# Patient Record
Sex: Female | Born: 1995 | Race: White | Hispanic: Yes | Marital: Single | Smoking: Former smoker
Health system: Southern US, Community
[De-identification: ages and names within clinical notes are randomized; demographics above are authoritative.]

## PROBLEM LIST (undated history)

## (undated) DIAGNOSIS — I10 Essential (primary) hypertension: Secondary | ICD-10-CM

## (undated) DIAGNOSIS — E079 Disorder of thyroid, unspecified: Secondary | ICD-10-CM

---
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
## 2015-11-04 ENCOUNTER — Encounter: Payer: Self-pay | Admitting: Emergency Medicine

## 2015-11-04 ENCOUNTER — Emergency Department
Admission: EM | Admit: 2015-11-04 | Discharge: 2015-11-04 | Disposition: A | Discharge status: Home / Self Care | Payer: BLUE CROSS/BLUE SHIELD | Attending: Emergency Medicine | Admitting: Emergency Medicine

## 2015-11-04 ENCOUNTER — Emergency Department: Payer: BLUE CROSS/BLUE SHIELD

## 2015-11-04 DIAGNOSIS — W06XXXA Fall from bed, initial encounter: Secondary | ICD-10-CM | POA: Insufficient documentation

## 2015-11-04 DIAGNOSIS — Y9289 Other specified places as the place of occurrence of the external cause: Secondary | ICD-10-CM | POA: Insufficient documentation

## 2015-11-04 DIAGNOSIS — H05232 Hemorrhage of left orbit: Secondary | ICD-10-CM

## 2015-11-04 DIAGNOSIS — Y999 Unspecified external cause status: Secondary | ICD-10-CM | POA: Insufficient documentation

## 2015-11-04 DIAGNOSIS — Y939 Activity, unspecified: Secondary | ICD-10-CM | POA: Diagnosis not present

## 2015-11-04 DIAGNOSIS — W2203XA Walked into furniture, initial encounter: Secondary | ICD-10-CM | POA: Diagnosis not present

## 2015-11-04 DIAGNOSIS — F17200 Nicotine dependence, unspecified, uncomplicated: Secondary | ICD-10-CM | POA: Insufficient documentation

## 2015-11-04 DIAGNOSIS — H1132 Conjunctival hemorrhage, left eye: Secondary | ICD-10-CM | POA: Diagnosis not present

## 2015-11-04 DIAGNOSIS — S0512XA Contusion of eyeball and orbital tissues, left eye, initial encounter: Secondary | ICD-10-CM | POA: Diagnosis not present

## 2015-11-04 DIAGNOSIS — S0990XA Unspecified injury of head, initial encounter: Secondary | ICD-10-CM | POA: Diagnosis present

## 2015-11-04 NOTE — ED Notes (Signed)
Pt to ed with c/o falling off the bed and hitting head on the nightstand on wed evening.  Pt with bruising noted to left eye and hematoma to left forehead.  Pt alert and oriented at this time.  Pt denies loss of consciousness during the fall.

## 2015-11-04 NOTE — ED Provider Notes (Signed)
CSN: [id number]     Arrival date & time 11/04/15  1051 History   First MD Initiated Contact with Patient 11/04/15 1125     Chief Complaint  Patient presents with  . Head Injury     (Consider location/radiation/quality/duration/timing/severity/associated sxs/prior Treatment) HPI  20 year old female presents to emergency department for evaluation of left orbital pain and swelling. Patient states 3 days ago, she was playing with her sister in the bed, patient rolled over, hit her left superior orbital rim on the dresser beside the bed. Patient had instant pain, no loss of consciousness. She developed significant swelling throughout the left eye lid. Patient has had no episodes of nausea or vomiting. Her pain and swelling have improved. Pain today is 2 out of 10 along the left superior orbital rim. Patient's eyelid is swollen shut. She denies any vision changes. No pain with extraocular movement.  History reviewed. No pertinent past medical history. History reviewed. No pertinent past surgical history. History reviewed. No pertinent family history. Social History  Substance Use Topics  . Smoking status: Current Every Day Smoker  . Smokeless tobacco: None  . Alcohol Use: Yes   OB History    Gravida Para Term Preterm AB TAB SAB Ectopic Multiple Living       Review of Systems  Constitutional: Negative for fever, chills, activity change and fatigue.  HENT: Negative for congestion, sinus pressure and sore throat.   Eyes: Negative for visual disturbance.  Respiratory: Negative for cough, chest tightness and shortness of breath.   Cardiovascular: Negative for chest pain and leg swelling.  Gastrointestinal: Negative for nausea, vomiting, abdominal pain and diarrhea.  Genitourinary: Negative for dysuria.  Musculoskeletal: Negative for joint swelling, arthralgias, gait problem, neck pain and neck stiffness.  Skin: Positive for wound. Negative for rash.  Neurological:  Negative for weakness, numbness and headaches.  Hematological: Negative for adenopathy.  Psychiatric/Behavioral: Negative for behavioral problems, confusion and agitation.      Allergies  Review of patient's allergies indicates no known allergies.  Home Medications   Prior to Admission medications   Not on File   BP 151/98 mmHg  Pulse 71  Temp(Src) 98.3 F (36.8 C) (Oral)  Resp 18  Ht  (1.702 m)  Wt 90.719 kg  BMI 31.32 kg/m2  SpO2 98%  LMP 11/04/2014 Physical Exam  Constitutional: She is oriented to person, place, and time. She appears well-developed and well-nourished. No distress.  HENT:  Head: Normocephalic.  Right Ear: External ear normal.  Left Ear: External ear normal.  Mouth/Throat: Oropharynx is clear and moist. No oropharyngeal exudate.  Examination of the left periorbital region shows patient has a periorbital hematoma along the left superior orbital rim into the left upper eyelid. Patient has full extraocular movement with no discomfort. There is mild subconjunctival hemorrhage with no visible injury to the cornea. Patient denies any vision changes. She is point tender along the superior orbital rim, mainly tender along the periorbital hematoma.  Eyes: EOM are normal. Pupils are equal, round, and reactive to light. Right eye exhibits no discharge. Left eye exhibits no discharge.  Neck: Normal range of motion. Neck supple.  Cardiovascular: Normal rate, regular rhythm and intact distal pulses.   Pulmonary/Chest: Effort normal and breath sounds normal. No respiratory distress. She exhibits no tenderness.  Abdominal: Soft.  Musculoskeletal: Normal range of motion. She exhibits no edema.  Neurological: She is alert and oriented to person, place, and time. She  has normal reflexes.  Skin: Skin is warm and dry.  Psychiatric: She has a normal mood and affect. Her behavior is normal. Thought content normal.    ED Course  Procedures (including critical care  time) Labs Review Labs Reviewed - No data to display  Imaging Review Ct Maxillofacial Wo Cm  11/04/2015  CLINICAL DATA:  20 year old female with a history of fall and trauma EXAM: CT MAXILLOFACIAL WITHOUT CONTRAST TECHNIQUE: Multidetector CT imaging of the maxillofacial structures was performed. Multiplanar CT image reconstructions were also generated. A small metallic BB was placed on the right temple in order to reliably differentiate right from left. COMPARISON:  None. FINDINGS: Mandible: No acute fracture identified. Temporomandibular joints approximated. Evidence of endodontal disease. Mid face: Soft tissue swelling with hyperdense soft tissues in the left supraorbital region. No underlying fracture. Paranasal sinuses: Trace paranasal sinus disease of the right maxillary sinus. Symmetry maintained of the bilateral facial soft tissues. No subcutaneous gas. No hematoma. No soft tissue abnormality. Multiple head and neck lymph nodes are present bilaterally, none of which are suspicious by CT size criteria or configuration. Orbits: Left preseptal soft tissue swelling. No radiopaque foreign body. No retro-orbital swelling. Unremarkable appearance the left globe. Unremarkable appearance of the visualized intracranial structures. Unremarkable appearance of the visualized craniocervical junction and upper cervical spine IMPRESSION: Soft tissue swelling in the left supraorbital region as well as the left preseptal region without evidence of fracture or globe injury. Signed, Yvone NeuJaime S. Loreta AveWagner, DO [facility] Electronically Signed   By: Gilmer MorJaime  Wagner D.O.   On: 11/04/2015 12:00   I have personally reviewed and evaluated these images and lab results as part of my medical decision-making.   EKG Interpretation None      MDM   Final diagnoses:  Periorbital hematoma of left eye    20 year old female with left periorbital hematoma. CT maxillofacial  negative, no orbital fracture or entrapment. Vision is normal. Patient will continue with ice. She is educated on signs and symptoms return to the emergency department for.    Evon Slackhomas C Edilia Ghuman, PA-C 11/04/15 1235  Phineas SemenGraydon Goodman, MD 11/04/15 [phone]

## 2015-11-04 NOTE — Discharge Instructions (Signed)
Eye Contusion An eye contusion is a deep bruise of the eye. This is often called a "black eye." Contusions are the result of an injury that caused bleeding under the skin. The contusion may turn blue, purple, or yellow. Minor injuries will give you a painless contusion, but more severe contusions may stay painful and swollen for a few weeks. If the eye contusion only involves the eyelids and tissues around the eye, the injured area will get better within a few days to weeks. However, eye contusions can be serious and affect the eyeball and sight. CAUSES   Blunt injury or trauma to the face or eye area.  A forehead injury that causes the blood under the skin to work its way down to the eyelids.  Rubbing the eyes due to irritation. SYMPTOMS   Swelling and redness around the eye.  Bruising around the eye.  Tenderness, soreness, or pain around the eye.  Blurry vision.  Tearing.  Eyeball redness. DIAGNOSIS  A diagnosis is usually based on a thorough exam of the eye and surrounding area. The eye must be looked at carefully to make sure it is not injured and to make sure nothing else will threaten your vision. A vision test may be done. An X-ray or computed tomography (CT) scan may be needed to determine if there are any associated injuries, such as broken bones (fractures). TREATMENT  If there is an injury to the eye, treatment will be determined by the nature of the injury. HOME CARE INSTRUCTIONS   Put ice on the injured area.  Put ice in a plastic bag.  Place a towel between your skin and the bag.  Leave the ice on for 15-20 minutes, 03-04 times a day.  If it is determined that there is no injury to the eye, you may continue normal activities.  Sunglasses may be worn to protect your eyes from bright light if light is uncomfortable.  Sleep with your head elevated. You can put an extra pillow under your head. This may help with discomfort.  Only take over-the-counter or  prescription medicines for pain, discomfort, or fever as directed by your caregiver. Do not take aspirin for the first few days. This may increase bruising. SEEK IMMEDIATE MEDICAL CARE IF:   You have any form of vision loss.  You have double vision.  You feel nauseous.  You feel dizzy, sleepy, or like you will faint.  You have any fluid discharge from the eye or your nose.  You have swelling and discoloration that does not fade. MAKE SURE YOU:   Understand these instructions.  Will watch your condition.  Will get help right away if you are not doing well or get worse.   This information is not intended to replace advice given to you by your health care provider. Make sure you discuss any questions you have with your health care provider.   Document Released: 07/12/2000 Document Revised: 10/07/2011 Document Reviewed: 03/21/2015 Elsevier Interactive Patient Education 2016 [employer]vier Inc.  

## 2015-11-04 NOTE — ED Notes (Signed)
NAD noted at time of D/C. Pt denies questions or concerns. Pt ambulatory to the lobby at this time.  

## 2017-09-09 DIAGNOSIS — E161 Other hypoglycemia: Secondary | ICD-10-CM | POA: Insufficient documentation

## 2018-06-05 ENCOUNTER — Other Ambulatory Visit (HOSPITAL_COMMUNITY): Payer: Self-pay | Admitting: Family Medicine

## 2018-06-05 DIAGNOSIS — R10813 Right lower quadrant abdominal tenderness: Secondary | ICD-10-CM

## 2019-06-16 DIAGNOSIS — I10 Essential (primary) hypertension: Secondary | ICD-10-CM | POA: Insufficient documentation

## 2019-11-12 ENCOUNTER — Ambulatory Visit: Payer: Self-pay | Attending: Internal Medicine

## 2019-11-12 DIAGNOSIS — Z23 Encounter for immunization: Secondary | ICD-10-CM

## 2019-11-12 NOTE — Progress Notes (Signed)
   Covid-19 Vaccination Clinic  Name:  Marissa Rice    MRN: [id number] DOB: 12/19/1995  11/12/2019  Marissa Rice was observed post Covid-19 immunization for 15 minutes without incident. She was provided with Vaccine Information Sheet and instruction to access the V-Safe system.   Marissa Rice was instructed to call 911 with any severe reactions post vaccine: Marland Kitchen Difficulty breathing  . Swelling of face and throat  . A fast heartbeat  . A bad rash all over body  . Dizziness and weakness   Immunizations Administered    Name Date Dose VIS Date Route   Pfizer COVID-19 Vaccine 11/12/2019  8:44 AM 0.3 mL 07/09/2019 Intramuscular   Manufacturer: [employer], [employer]   Lot: [id number]   NDC: [address]-8

## 2019-12-04 ENCOUNTER — Ambulatory Visit: Payer: Self-pay | Attending: Internal Medicine

## 2019-12-04 DIAGNOSIS — Z23 Encounter for immunization: Secondary | ICD-10-CM

## 2019-12-04 NOTE — Progress Notes (Signed)
   Covid-19 Vaccination Clinic  Name:  Marissa Rice    MRN: [id number] DOB: 1996/05/10  12/04/2019  [name] was observed post Covid-19 immunization for 15 minutes without incident. She was provided with Vaccine Information Sheet and instruction to access the V-Safe system.   [name] was instructed to call 911 with any severe reactions post vaccine: Marland Kitchen Difficulty breathing  . Swelling of face and throat  . A fast heartbeat  . A bad rash all over body  . Dizziness and weakness   Immunizations Administered    Name Date Dose VIS Date Route   Pfizer COVID-19 Vaccine 12/04/2019  8:42 AM 0.3 mL 09/22/2018 Intramuscular   Manufacturer: [employer], [employer]   Lot: [id number]   NDC: [address]-1

## 2020-07-30 ENCOUNTER — Ambulatory Visit
Admission: EM | Admit: 2020-07-30 | Discharge: 2020-07-30 | Disposition: A | Discharge status: Home / Self Care | Payer: Self-pay | Attending: Family Medicine | Admitting: Family Medicine

## 2020-07-30 ENCOUNTER — Other Ambulatory Visit: Payer: Self-pay

## 2020-07-30 ENCOUNTER — Encounter: Payer: Self-pay | Admitting: Emergency Medicine

## 2020-07-30 DIAGNOSIS — R202 Paresthesia of skin: Secondary | ICD-10-CM

## 2020-07-30 DIAGNOSIS — I10 Essential (primary) hypertension: Secondary | ICD-10-CM

## 2020-07-30 DIAGNOSIS — F411 Generalized anxiety disorder: Secondary | ICD-10-CM

## 2020-07-30 HISTORY — DX: Disorder of thyroid, unspecified: E07.9

## 2020-07-30 HISTORY — DX: Essential (primary) hypertension: I10

## 2020-07-30 MED ORDER — HYDROXYZINE HCL 25 MG PO TABS: 25.0000 mg | ORAL_TABLET | Freq: Three times a day (TID) | ORAL | 0 refills | Status: DC | PRN

## 2020-07-30 NOTE — Discharge Instructions (Signed)
Medication as prescribed.  This should help with the anxiety and likely the numbness and tingling that you are experiencing.  Please follow-up with your primary care physician.  Take care  Dr. Adriana Simas

## 2020-07-30 NOTE — ED Triage Notes (Addendum)
Patient c/o left arm, back and left leg tingling that started 2 weeks ago. She states she has been having anxiety attacks recently. Patient is supposed to be taking BP medications but does not.

## 2020-07-31 NOTE — ED Provider Notes (Signed)
[facility]    CSN: [id number] Arrival date & time: 07/30/20  1207  History   Chief Complaint Chief Complaint  Patient presents with  . Numbness    [phone] left arm, back and left leg   HPI  25 year old female presents with multiple complaints.  2 week history of left arm, left flank, left leg tingling/numbness. She also notes some facial itching/numbness. Also reports tingling of the finger tips. Additionally, patient states that she is having frequently panic attacks. These occurred after arguing with her mom. No relieving factors. No reported weakness. No relieving factors. No other complaints.   Past Medical History:  Diagnosis Date  . Hypertension   . Thyroid disease    Home Medications    Prior to Admission medications   Medication Sig Start Date End Date Taking? Authorizing Provider  hydrOXYzine (ATARAX/VISTARIL) 25 MG tablet Take 1 tablet (25 mg total) by mouth every 8 (eight) hours as needed for anxiety. 07/30/20  Yes Tommie Sams, DO   Social History Social History   Tobacco Use  . Smoking status: Current Every Day Smoker  . Smokeless tobacco: Never Used  Substance Use Topics  . Alcohol use: Yes  . Drug use: Not Currently    Types: Marijuana     Allergies   Hydrochlorothiazide   Review of Systems Review of Systems Per HPI  Physical Exam Triage Vital Signs ED Triage Vitals  Enc Vitals Group     BP 07/30/20 1608 (!) 149/110     Pulse Rate 07/30/20 1608 100     Resp 07/30/20 1608 18     Temp 07/30/20 1608 98.8 F (37.1 C)     Temp Source 07/30/20 1608 Oral     SpO2 07/30/20 1608 100 %     Weight 07/30/20 1456 248 lb (112.5 kg)     Height 07/30/20 1456 5\' 7"  (1.702 m)     Head Circumference --      Peak Flow --      Pain Score 07/30/20 1456 0     Pain Loc --      Pain Edu? --      Excl. in GC? --    Updated Vital Signs BP (!) 149/110 (BP Location: Left Arm)   Pulse 100   Temp 98.8 F (37.1 C) (Oral)   Resp 18   Ht 5\' 7"   (1.702 m)   Wt 112.5 kg   LMP 10/29/2019   SpO2 100%   BMI 38.84 kg/m   Visual Acuity Right Eye Distance:   Left Eye Distance:   Bilateral Distance:    Right Eye Near:   Left Eye Near:    Bilateral Near:     Physical Exam Vitals and nursing note reviewed.  Constitutional:      Appearance: Normal appearance. She is obese.  Eyes:     General:        Right eye: No discharge.        Left eye: No discharge.     Conjunctiva/sclera: Conjunctivae normal.  Cardiovascular:     Rate and Rhythm: Normal rate and regular rhythm.  Pulmonary:     Effort: Pulmonary effort is normal.     Breath sounds: Normal breath sounds. No wheezing, rhonchi or rales.  Neurological:     Mental Status: She is alert.  Psychiatric:     Comments: Flat affect.    UC Treatments / Results  Labs (all labs ordered are listed, but only abnormal results are  displayed) Labs Reviewed - No data to display  EKG   Radiology No results found.  Procedures Procedures (including critical care time)  Medications Ordered in UC Medications - No data to display  Initial Impression / Assessment and Plan / UC Course  I have reviewed the triage vital signs and the nursing notes.  Pertinent labs & imaging results that were available during my care of the patient were reviewed by me and considered in my medical decision making (see chart for details).    25 year old female presents with multiple complaints. Well appearing on exam. I suspect that anxiety is the culprit of her currently complaints. Needs close PCP follow up. PRN Atarax.   Final Clinical Impressions(s) / UC Diagnoses   Final diagnoses:  Paresthesia  Essential hypertension  Anxiety state     Discharge Instructions     Medication as prescribed.  This should help with the anxiety and likely the numbness and tingling that you are experiencing.  Please follow-up with your primary care physician.  Take care  Dr. Adriana Simas    ED Prescriptions     Medication Sig Dispense Auth. Provider   hydrOXYzine (ATARAX/VISTARIL) 25 MG tablet Take 1 tablet (25 mg total) by mouth every 8 (eight) hours as needed for anxiety. 30 tablet Tommie Sams, DO     PDMP not reviewed this encounter.   Tommie Sams, [place] 07/31/20 1408

## 2020-08-23 ENCOUNTER — Other Ambulatory Visit (HOSPITAL_COMMUNITY): Payer: Self-pay | Admitting: Family Medicine

## 2020-08-23 ENCOUNTER — Other Ambulatory Visit
Admission: RE | Admit: 2020-08-23 | Discharge: 2020-08-23 | Disposition: A | Discharge status: Home / Self Care | Payer: Self-pay | Source: Ambulatory Visit | Attending: Family Medicine | Admitting: Family Medicine

## 2020-08-23 ENCOUNTER — Other Ambulatory Visit: Payer: Self-pay | Admitting: Family Medicine

## 2020-08-23 DIAGNOSIS — R1084 Generalized abdominal pain: Secondary | ICD-10-CM

## 2020-08-23 DIAGNOSIS — R0600 Dyspnea, unspecified: Secondary | ICD-10-CM | POA: Insufficient documentation

## 2020-08-23 LAB — FIBRIN DERIVATIVES D-DIMER (ARMC ONLY): Fibrin derivatives D-dimer (ARMC): 82.11 ng/mL (FEU) (ref 0.00–499.00)

## 2020-08-28 ENCOUNTER — Other Ambulatory Visit: Payer: Self-pay

## 2020-08-28 ENCOUNTER — Ambulatory Visit
Admission: RE | Admit: 2020-08-28 | Discharge: 2020-08-28 | Disposition: A | Discharge status: Home / Self Care | Payer: Self-pay | Source: Ambulatory Visit | Attending: Family Medicine | Admitting: Family Medicine

## 2020-08-28 DIAGNOSIS — R1084 Generalized abdominal pain: Secondary | ICD-10-CM | POA: Insufficient documentation

## 2021-01-26 ENCOUNTER — Other Ambulatory Visit: Payer: Self-pay | Admitting: Otolaryngology

## 2021-01-26 DIAGNOSIS — R430 Anosmia: Secondary | ICD-10-CM

## 2021-02-12 ENCOUNTER — Ambulatory Visit
Admission: RE | Admit: 2021-02-12 | Discharge: 2021-02-12 | Disposition: A | Discharge status: Home / Self Care | Payer: 59 | Source: Ambulatory Visit | Attending: Otolaryngology | Admitting: Otolaryngology

## 2021-02-12 ENCOUNTER — Other Ambulatory Visit: Payer: Self-pay

## 2021-02-12 DIAGNOSIS — R430 Anosmia: Secondary | ICD-10-CM | POA: Diagnosis present

## 2021-02-12 MED ORDER — GADOBUTROL 1 MMOL/ML IV SOLN
10.0000 mL | Freq: Once | INTRAVENOUS | Status: AC | PRN
  Administered 2021-02-12: 10 mL via INTRAVENOUS

## 2021-02-15 DIAGNOSIS — J310 Chronic rhinitis: Secondary | ICD-10-CM | POA: Insufficient documentation

## 2021-02-15 DIAGNOSIS — R519 Headache, unspecified: Secondary | ICD-10-CM | POA: Insufficient documentation

## 2021-03-03 ENCOUNTER — Emergency Department: Payer: 59

## 2021-03-03 ENCOUNTER — Emergency Department
Admission: EM | Admit: 2021-03-03 | Discharge: 2021-03-03 | Disposition: A | Discharge status: Home / Self Care | Payer: 59 | Attending: Student in an Organized Health Care Education/Training Program | Admitting: Student in an Organized Health Care Education/Training Program

## 2021-03-03 ENCOUNTER — Other Ambulatory Visit: Payer: Self-pay

## 2021-03-03 DIAGNOSIS — R202 Paresthesia of skin: Secondary | ICD-10-CM | POA: Diagnosis not present

## 2021-03-03 DIAGNOSIS — I10 Essential (primary) hypertension: Secondary | ICD-10-CM | POA: Diagnosis not present

## 2021-03-03 DIAGNOSIS — F17200 Nicotine dependence, unspecified, uncomplicated: Secondary | ICD-10-CM | POA: Diagnosis not present

## 2021-03-03 LAB — BASIC METABOLIC PANEL
Anion gap: 8 (ref 5–15)
BUN: 11 mg/dL (ref 6–20)
CO2: 23 mmol/L (ref 22–32)
Calcium: 9 mg/dL (ref 8.9–10.3)
Chloride: 106 mmol/L (ref 98–111)
Creatinine, Ser: 0.7 mg/dL (ref 0.44–1.00)
GFR, Estimated: >60 mL/min (ref >60)
Glucose, Bld: 104 mg/dL — ABNORMAL HIGH (ref 70–99)
Potassium: 3.7 mmol/L (ref 3.5–5.1)
Sodium: 137 mmol/L (ref 135–145)

## 2021-03-03 LAB — URINALYSIS, COMPLETE (UACMP) WITH MICROSCOPIC
Bacteria, UA: NONE SEEN
Bilirubin Urine: NEGATIVE
Glucose, UA: NEGATIVE mg/dL
Hgb urine dipstick: NEGATIVE
Ketones, ur: NEGATIVE mg/dL
Leukocytes,Ua: NEGATIVE
Nitrite: NEGATIVE
Protein, ur: NEGATIVE mg/dL
Specific Gravity, Urine: 1.011 (ref 1.005–1.030)
pH: 6 (ref 5.0–8.0)

## 2021-03-03 LAB — CBC
HCT: 41.7 % (ref 36.0–46.0)
Hemoglobin: 14.6 g/dL (ref 12.0–15.0)
MCH: 29 pg (ref 26.0–34.0)
MCHC: 35 g/dL (ref 30.0–36.0)
MCV: 82.7 fL (ref 80.0–100.0)
Platelets: 282 10*3/uL (ref 150–400)
RBC: 5.04 MIL/uL (ref 3.87–5.11)
RDW: 13.2 % (ref 11.5–15.5)
WBC: 7.9 10*3/uL (ref 4.0–10.5)
nRBC: 0 % (ref 0.0–0.2)

## 2021-03-03 LAB — POC URINE PREG, ED: Preg Test, Ur: NEGATIVE

## 2021-03-03 MED ORDER — GADOBUTROL 1 MMOL/ML IV SOLN
10.0000 mL | Freq: Once | INTRAVENOUS | Status: AC | PRN
  Administered 2021-03-03: 10 mL via INTRAVENOUS

## 2021-03-03 MED ORDER — DIAZEPAM 2 MG PO TABS: 2.0000 mg | ORAL_TABLET | Freq: Three times a day (TID) | ORAL | 0 refills | Status: AC | PRN

## 2021-03-03 MED ORDER — DIAZEPAM 5 MG PO TABS
5.0000 mg | ORAL_TABLET | Freq: Once | ORAL | Status: AC
  Administered 2021-03-03: 5 mg via ORAL
  Filled 2021-03-03: qty 1

## 2021-03-03 NOTE — ED Notes (Signed)
Pt alert and oriented, verbalized understanding of discharge instructions. 

## 2021-03-03 NOTE — ED Provider Notes (Signed)
[facility] Emergency Department Provider Note    Event Date/Time   First MD Initiated Contact with Patient 03/03/21 1613     (approximate)  I have reviewed the triage vital signs and the nursing notes.   HISTORY  Chief Complaint Numbness    HPI Hildegard Hlavac is a 25 y.o. female below listed past medical history presents to the ER for evaluation of tingling and numbness sensation in her left arm as well as left face and bilateral knees.  States that this happens on and off but became more severe recently.  Denies any history of MS or CVA does have history of obesity as well as high blood pressure and prediabetes.  Has family history of early stroke.  No recent fevers no nausea or vomiting.  Past Medical History:  Diagnosis Date   Hypertension    Thyroid disease    No family history on file. History reviewed. No pertinent surgical history. There are no problems to display for this patient.     Prior to Admission medications   Medication Sig Start Date End Date Taking? Authorizing Provider  diazepam (VALIUM) 2 MG tablet Take 1 tablet (2 mg total) by mouth every 8 (eight) hours as needed for anxiety. 03/03/21 03/03/22 Yes Willy Eddy, MD  hydrOXYzine (ATARAX/VISTARIL) 25 MG tablet Take 1 tablet (25 mg total) by mouth every 8 (eight) hours as needed for anxiety. 07/30/20   Tommie Sams, DO    Allergies Hydrochlorothiazide    Social History Social History   Tobacco Use   Smoking status: Every Day   Smokeless tobacco: Never  Substance Use Topics   Alcohol use: Yes   Drug use: Not Currently    Types: Marijuana    Review of Systems Patient denies headaches, rhinorrhea, blurry vision, numbness, shortness of breath, chest pain, edema, cough, abdominal pain, nausea, vomiting, diarrhea, dysuria, fevers, rashes or hallucinations unless otherwise stated above in HPI. ____________________________________________   PHYSICAL EXAM:  VITAL  SIGNS: Vitals:   03/03/21 1900 03/03/21 2020  BP: 127/78 (!) 148/94  Pulse: 73 77  Resp: 15 16  Temp:  98.2 F (36.8 C)  SpO2: 100% 99%    Constitutional: Alert and oriented.  Eyes: Conjunctivae are normal.  Head: Atraumatic. Nose: No congestion/rhinnorhea. Mouth/Throat: Mucous membranes are moist.   Neck: No stridor. Painless ROM.  Cardiovascular: Normal rate, regular rhythm. Grossly normal heart sounds.  Good peripheral circulation. Respiratory: Normal respiratory effort.  No retractions. Lungs CTAB. Gastrointestinal: Soft and nontender. No distention. No abdominal bruits. No CVA tenderness. Genitourinary:  Musculoskeletal: No lower extremity tenderness nor edema.  No joint effusions. Neurologic:  Normal speech and language. No gross focal neurologic deficits are appreciated. No facial droop Skin:  Skin is warm, dry and intact. No rash noted. Psychiatric: Mood and affect are normal. Speech and behavior are normal.  ____________________________________________   LABS (all labs ordered are listed, but only abnormal results are displayed)  Results for orders placed or performed during the hospital encounter of 03/03/21 (from the past 24 hour(s))  Basic metabolic panel     Status: Abnormal   Collection Time: 03/03/21  1:55 PM  Result Value Ref Range   Sodium 137 135 - 145 mmol/L   Potassium 3.7 3.5 - 5.1 mmol/L   Chloride 106 98 - 111 mmol/L   CO2 23 22 - 32 mmol/L   Glucose, Bld 104 (H) 70 - 99 mg/dL   BUN 11 6 - 20 mg/dL   Creatinine, Ser  0.70 0.44 - 1.00 mg/dL   Calcium 9.0 8.9 - 67.2 mg/dL   GFR, Estimated >09 >47 mL/min   Anion gap 8 5 - 15  CBC     Status: None   Collection Time: 03/03/21  1:55 PM  Result Value Ref Range   WBC 7.9 4.0 - 10.5 K/uL   RBC 5.04 3.87 - 5.11 MIL/uL   Hemoglobin 14.6 12.0 - 15.0 g/dL   HCT 09.6 28.3 - 66.2 %   MCV 82.7 80.0 - 100.0 fL   MCH 29.0 26.0 - 34.0 pg   MCHC 35.0 30.0 - 36.0 g/dL   RDW 94.7 65.4 - 65.0 %   Platelets 282  150 - 400 K/uL   nRBC 0.0 0.0 - 0.2 %  Urinalysis, Complete w Microscopic     Status: Abnormal   Collection Time: 03/03/21  5:56 PM  Result Value Ref Range   Color, Urine YELLOW (A) YELLOW   APPearance HAZY (A) CLEAR   Specific Gravity, Urine 1.011 1.005 - 1.030   pH 6.0 5.0 - 8.0   Glucose, UA NEGATIVE NEGATIVE mg/dL   Hgb urine dipstick NEGATIVE NEGATIVE   Bilirubin Urine NEGATIVE NEGATIVE   Ketones, ur NEGATIVE NEGATIVE mg/dL   Protein, ur NEGATIVE NEGATIVE mg/dL   Nitrite NEGATIVE NEGATIVE   Leukocytes,Ua NEGATIVE NEGATIVE   RBC / HPF 0-5 0 - 5 RBC/hpf   WBC, UA 0-5 0 - 5 WBC/hpf   Bacteria, UA NONE SEEN NONE SEEN   Squamous Epithelial / LPF 0-5 0 - 5   Mucus PRESENT   POC urine preg, ED     Status: None   Collection Time: 03/03/21  6:02 PM  Result Value Ref Range   Preg Test, Ur NEGATIVE NEGATIVE   ____________________________________________  EKG My review and personal interpretation at Time: 13:58   Indication: tingling  Rate: 90  Rhythm: sinus Axis: normal Other: normal intervals, no stemi ____________________________________________  RADIOLOGY  I personally reviewed all radiographic images ordered to evaluate for the above acute complaints and reviewed radiology reports and findings.  These findings were personally discussed with the patient.  Please see medical record for radiology report.  ____________________________________________   PROCEDURES  Procedure(s) performed:  Procedures    Critical Care performed: no ____________________________________________   INITIAL IMPRESSION / ASSESSMENT AND PLAN / ED COURSE  Pertinent labs & imaging results that were available during my care of the patient were reviewed by me and considered in my medical decision making (see chart for details).   DDX: anxiety, radiculopathy, cva, ms, mass  Richard Holz is a 25 y.o. who presents to the ED with symptoms as described above.  Patient nontoxic-appearing and  describing paresthesias numbness sensation.  Given distribution of symptoms will order MRI due to concern for possible MS.  Lower suspicion for CVA or TIA.  Does seem very anxious.  Has requested anxiolytic.  Clinical Course as of 03/03/21 2042  Sat Mar 03, 2021  2038 Patient reassessed.  Feels well.  Felt significantly improved after Valium.  MRI is reassuring.  Does appear stable and appropriate for outpatient follow-up. [PR]    Clinical Course User Index [PR] Willy Eddy, MD    The patient was evaluated in Emergency Department today for the symptoms described in the history of present illness. He/she was evaluated in the context of the global COVID-19 pandemic, which necessitated consideration that the patient might be at risk for infection with the SARS-CoV-2 virus that causes COVID-19. Institutional protocols and algorithms that  pertain to the evaluation of patients at risk for COVID-19 are in a state of rapid change based on information released by regulatory bodies including the CDC and federal and state organizations. These policies and algorithms were followed during the patient's care in the ED.  As part of my medical decision making, I reviewed the following data within the electronic MEDICAL RECORD NUMBER Nursing notes reviewed and incorporated, Labs reviewed, notes from prior ED visits and [place] Controlled Substance Database   ____________________________________________   FINAL CLINICAL IMPRESSION(S) / ED DIAGNOSES  Final diagnoses:  Paresthesia      NEW MEDICATIONS STARTED DURING THIS VISIT:  New Prescriptions   DIAZEPAM (VALIUM) 2 MG TABLET    Take 1 tablet (2 mg total) by mouth every 8 (eight) hours as needed for anxiety.     Note:  This document was prepared using Dragon voice recognition software and may include unintentional dictation errors.    Willy Eddy, MD 03/03/21 2042

## 2021-03-03 NOTE — ED Triage Notes (Signed)
Pt states that she started having numbness in her left arm and leg, bilat hands, and bilat knees last night- pt states also lightheadedness

## 2021-03-03 NOTE — ED Notes (Signed)
Pt care taken, went to MRI.

## 2021-07-08 ENCOUNTER — Emergency Department: Payer: 59

## 2021-07-08 ENCOUNTER — Emergency Department
Admission: EM | Admit: 2021-07-08 | Discharge: 2021-07-08 | Disposition: A | Discharge status: Home / Self Care | Payer: 59 | Attending: Emergency Medicine | Admitting: Emergency Medicine

## 2021-07-08 ENCOUNTER — Encounter: Payer: Self-pay | Admitting: Emergency Medicine

## 2021-07-08 ENCOUNTER — Other Ambulatory Visit: Payer: Self-pay

## 2021-07-08 DIAGNOSIS — R1030 Lower abdominal pain, unspecified: Secondary | ICD-10-CM | POA: Insufficient documentation

## 2021-07-08 DIAGNOSIS — F17200 Nicotine dependence, unspecified, uncomplicated: Secondary | ICD-10-CM | POA: Insufficient documentation

## 2021-07-08 DIAGNOSIS — Z20822 Contact with and (suspected) exposure to covid-19: Secondary | ICD-10-CM | POA: Insufficient documentation

## 2021-07-08 DIAGNOSIS — R300 Dysuria: Secondary | ICD-10-CM | POA: Diagnosis not present

## 2021-07-08 DIAGNOSIS — R531 Weakness: Secondary | ICD-10-CM | POA: Diagnosis not present

## 2021-07-08 DIAGNOSIS — R109 Unspecified abdominal pain: Secondary | ICD-10-CM

## 2021-07-08 DIAGNOSIS — I10 Essential (primary) hypertension: Secondary | ICD-10-CM | POA: Diagnosis not present

## 2021-07-08 DIAGNOSIS — R0602 Shortness of breath: Secondary | ICD-10-CM | POA: Diagnosis not present

## 2021-07-08 LAB — COMPREHENSIVE METABOLIC PANEL
ALT: 55 U/L — ABNORMAL HIGH (ref 0–44)
AST: 27 U/L (ref 15–41)
Albumin: 3.7 g/dL (ref 3.5–5.0)
Alkaline Phosphatase: 66 U/L (ref 38–126)
Anion gap: 9 (ref 5–15)
BUN: 10 mg/dL (ref 6–20)
CO2: 25 mmol/L (ref 22–32)
Calcium: 8.8 mg/dL — ABNORMAL LOW (ref 8.9–10.3)
Chloride: 102 mmol/L (ref 98–111)
Creatinine, Ser: 0.78 mg/dL (ref 0.44–1.00)
GFR, Estimated: >60 mL/min (ref >60)
Glucose, Bld: 135 mg/dL — ABNORMAL HIGH (ref 70–99)
Potassium: 3.5 mmol/L (ref 3.5–5.1)
Sodium: 136 mmol/L (ref 135–145)
Total Bilirubin: 0.5 mg/dL (ref 0.3–1.2)
Total Protein: 6.9 g/dL (ref 6.5–8.1)

## 2021-07-08 LAB — URINALYSIS, ROUTINE W REFLEX MICROSCOPIC
Bilirubin Urine: NEGATIVE
Glucose, UA: NEGATIVE mg/dL
Hgb urine dipstick: NEGATIVE
Ketones, ur: NEGATIVE mg/dL
Leukocytes,Ua: NEGATIVE
Nitrite: NEGATIVE
Protein, ur: NEGATIVE mg/dL
Specific Gravity, Urine: 1.023 (ref 1.005–1.030)
pH: 6 (ref 5.0–8.0)

## 2021-07-08 LAB — POC URINE PREG, ED: Preg Test, Ur: NEGATIVE

## 2021-07-08 LAB — RESP PANEL BY RT-PCR (FLU A&B, COVID) ARPGX2
Influenza A by PCR: NEGATIVE
Influenza B by PCR: NEGATIVE
SARS Coronavirus 2 by RT PCR: NEGATIVE

## 2021-07-08 LAB — CBC
HCT: 38.6 % (ref 36.0–46.0)
Hemoglobin: 13 g/dL (ref 12.0–15.0)
MCH: 28.3 pg (ref 26.0–34.0)
MCHC: 33.7 g/dL (ref 30.0–36.0)
MCV: 84.1 fL (ref 80.0–100.0)
Platelets: 282 10*3/uL (ref 150–400)
RBC: 4.59 MIL/uL (ref 3.87–5.11)
RDW: 13.1 % (ref 11.5–15.5)
WBC: 7.9 10*3/uL (ref 4.0–10.5)
nRBC: 0 % (ref 0.0–0.2)

## 2021-07-08 LAB — CBG MONITORING, ED: Glucose-Capillary: 106 mg/dL — ABNORMAL HIGH (ref 70–99)

## 2021-07-08 NOTE — ED Provider Notes (Signed)
[facility] Emergency Department Provider Note ____________________________________________   None    (approximate)  I have reviewed the triage vital signs and the nursing notes.   HISTORY  Chief Complaint Recurrent UTI and Weakness    HPI Marissa Rice is a 25 y.o. female with PMH as noted below who presents with bilateral flank pain, acute onset yesterday, radiating into the lower abdomen, and associated with some generalized weakness and shortness of breath.  The patient states that she had vaginal burning last month, was seen by OB/GYN and treated for a UTI.  She finished the antibiotic about 2 weeks ago.  She has had some intermittent lower abdominal pain but longer has any dysuria.  She is scheduled for urology follow-up in a few days.  However yesterday she developed the new symptoms so decided to come get checked out.  She denies cough, chest pain, nausea, vomiting, diarrhea, vaginal bleeding, discharge, or fever.   Past Medical History:  Diagnosis Date   Hypertension    Thyroid disease     There are no problems to display for this patient.   History reviewed. No pertinent surgical history.  Prior to Admission medications   Medication Sig Start Date End Date Taking? Authorizing Provider  diazepam (VALIUM) 2 MG tablet Take 1 tablet (2 mg total) by mouth every 8 (eight) hours as needed for anxiety. 03/03/21 03/03/22  Willy Eddy, MD  hydrOXYzine (ATARAX/VISTARIL) 25 MG tablet Take 1 tablet (25 mg total) by mouth every 8 (eight) hours as needed for anxiety. 07/30/20   Tommie Sams, DO    Allergies Hydrochlorothiazide  History reviewed. No pertinent family history.  Social History Social History   Tobacco Use   Smoking status: Every Day   Smokeless tobacco: Never  Substance Use Topics   Alcohol use: Yes   Drug use: Not Currently    Types: Marijuana    Review of Systems  Constitutional: No fever/chills.  Positive for  weakness. Eyes: No visual changes. ENT: No sore throat. Cardiovascular: Denies chest pain. Respiratory: Positive for shortness of breath. Gastrointestinal: No vomiting or diarrhea.  Genitourinary: Negative for dysuria or vaginal bleeding.  Musculoskeletal: Negative for back pain. Skin: Negative for rash. Neurological: Negative for headaches, focal weakness or numbness.   ____________________________________________   PHYSICAL EXAM:  VITAL SIGNS: ED Triage Vitals [07/08/21 0155]  Enc Vitals Group     BP (!) 161/98     Pulse Rate 72     Resp 20     Temp 97.9 F (36.6 C)     Temp Source Oral     SpO2 100 %     Weight 250 lb (113.4 kg)     Height 5\' 7"  (1.702 m)     Head Circumference      Peak Flow      Pain Score 7     Pain Loc      Pain Edu?      Excl. in GC?     Constitutional: Alert and oriented. Well appearing and in no acute distress. Eyes: Conjunctivae are normal.  Head: Atraumatic. Nose: No congestion/rhinnorhea. Mouth/Throat: Mucous membranes are moist.   Neck: Normal range of motion.  Cardiovascular: Normal rate, regular rhythm. Grossly normal heart sounds.  Good peripheral circulation. Respiratory: Normal respiratory effort.  No retractions. Lungs CTAB. Gastrointestinal: Soft with mild suprapubic tenderness.  Mild bilateral flank tenderness.  No distention.  Genitourinary: No CVA tenderness. Musculoskeletal: Extremities warm and well perfused.  Neurologic:  Normal speech  and language. No gross focal neurologic deficits are appreciated.  Skin:  Skin is warm and dry. No rash noted. Psychiatric: Mood and affect are normal. Speech and behavior are normal.  ____________________________________________   LABS (all labs ordered are listed, but only abnormal results are displayed)  Labs Reviewed  URINALYSIS, ROUTINE W REFLEX MICROSCOPIC - Abnormal; Notable for the following components:      Result Value   Color, Urine YELLOW (*)    APPearance HAZY (*)     Bacteria, UA RARE (*)    All other components within normal limits  COMPREHENSIVE METABOLIC PANEL - Abnormal; Notable for the following components:   Glucose, Bld 135 (*)    Calcium 8.8 (*)    ALT 55 (*)    All other components within normal limits  CBG MONITORING, ED - Abnormal; Notable for the following components:   Glucose-Capillary 106 (*)    All other components within normal limits  RESP PANEL BY RT-PCR (FLU A&B, COVID) ARPGX2  URINE CULTURE  CBC  POC URINE PREG, ED   ____________________________________________  EKG  ED ECG REPORT I, Dionne Bucy, the attending physician, personally viewed and interpreted this ECG.  Date: 07/08/2021 EKG Time: 0200 Rate: 84 Rhythm: normal sinus rhythm QRS Axis: normal Intervals: normal ST/T Wave abnormalities: normal Narrative Interpretation: no evidence of acute ischemia; no change when compared to EKG of 03/03/2021  ____________________________________________  RADIOLOGY  Chest x-ray interpreted by me shows no focal consolidation or edema  CT abdomen/pelvis:  IMPRESSION:  1. No acute abdominopelvic findings  2. Hepatic steatosis.    ____________________________________________   PROCEDURES  Procedure(s) performed: No  Procedures  Critical Care performed: No ____________________________________________   INITIAL IMPRESSION / ASSESSMENT AND PLAN / ED COURSE  Pertinent labs & imaging results that were available during my care of the patient were reviewed by me and considered in my medical decision making (see chart for details).   25 year old female with PMH as noted above, recently treated for UTI after experiencing subacute urinary symptoms, presents with bilateral flank and lower abdominal pain, as well as some nonspecific generalized weakness and shortness of breath since yesterday.  I reviewed the past medical records in Epic and Care Everywhere.  The patient was evaluated by OB/GYN on 11/17 for vaginal  burning and had a negative wet prep and inconclusive UA at that time, with a negative UA on 12/5.  Urine culture was positive for GBS and the patient was started on Augmentin.  She was given referral to urology and has an appointment on 12/15.  On exam the patient is well-appearing and her vital signs are normal.  The physical exam is unremarkable.  Lungs are clear.  Abdomen is soft with mild bilateral suprapubic discomfort but no significant focal tenderness.  Work-up obtained from triage is overall reassuring.  Urinalysis is not consistent with active UTI.  There is no leukocytosis.  EKG and chest x-ray are unremarkable.  Differential includes influenza, COVID, other viral syndrome, musculoskeletal pain, ureteral stone; I do not suspect recurrent or persistent UTI given the lack of any findings on the work-up.  There is no evidence of cardiac etiology for the shortness of breath in this young patient with no cardiac risk factors.  There is no clinical evidence for PE, and the patient is PERC negative.  We will obtain a respiratory panel, CT renal stone study and reassess.  ----------------------------------------- 9:57 AM on 07/08/2021 -----------------------------------------  CT is negative for acute findings.  The respiratory panel is  also negative.  At this time, the patient is stable for discharge home.  She continues to appear comfortable.  She has not required anything for pain in the ED.  Overall presentation is still consistent with possible viral syndrome versus musculoskeletal pain or other benign cause.  I counseled the patient on the results of the work-up.  She will follow-up with urology this week as scheduled.  Return precautions given, and she expresses understanding.   ____________________________________________   FINAL CLINICAL IMPRESSION(S) / ED DIAGNOSES  Final diagnoses:  Flank pain      NEW MEDICATIONS STARTED DURING THIS VISIT:  New Prescriptions   No  medications on file     Note:  This document was prepared using Dragon voice recognition software and may include unintentional dictation errors.    Dionne Bucy, MD 07/08/21 [phone]

## 2021-07-08 NOTE — ED Triage Notes (Signed)
Pt to ED via POV with c/o frequent and recurrent UTI, states has been being followed by OBGYN for same and has f/u appt with urologist for treatment on Thursday. Pt states now having weakness, SOB, upper and lower abd pain. Pt visualized in NAD at this time, pt ambulatory to triage with steady gait.

## 2021-07-08 NOTE — Discharge Instructions (Signed)
Take Tylenol or ibuprofen as needed for pain and drink plenty of fluids.  Follow-up with the urologist this week as scheduled.  Return to the ER for new, worsening, or persistent severe pain, weakness, difficulty breathing, fever, vomiting, or any other new or worsening symptoms that concern you.

## 2021-07-08 NOTE — ED Notes (Signed)
Pt in CT at this time.

## 2021-07-09 LAB — URINE CULTURE: Culture: 30000 — AB

## 2021-07-12 ENCOUNTER — Encounter: Payer: Self-pay | Admitting: Urology

## 2021-07-12 ENCOUNTER — Other Ambulatory Visit: Payer: Self-pay

## 2021-07-12 ENCOUNTER — Ambulatory Visit: Payer: 59 | Admitting: Urology

## 2021-07-12 VITALS — BP 138/98 | HR 82 | Ht 67.0 in | Wt 262.0 lb

## 2021-07-12 DIAGNOSIS — N390 Urinary tract infection, site not specified: Secondary | ICD-10-CM

## 2021-07-12 LAB — URINALYSIS, COMPLETE
Bilirubin, UA: NEGATIVE
Glucose, UA: NEGATIVE
Ketones, UA: NEGATIVE
Leukocytes,UA: NEGATIVE
Nitrite, UA: NEGATIVE
Protein,UA: NEGATIVE
RBC, UA: NEGATIVE
Specific Gravity, UA: 1.025 (ref 1.005–1.030)
Urobilinogen, Ur: 0.2 mg/dL (ref 0.2–1.0)
pH, UA: 6 (ref 5.0–7.5)

## 2021-07-12 LAB — MICROSCOPIC EXAMINATION: RBC, Urine: NONE SEEN /hpf (ref 0–2)

## 2021-07-12 MED ORDER — AMPICILLIN 500 MG PO CAPS: ORAL_CAPSULE | ORAL | 0 refills | Status: DC

## 2021-07-12 NOTE — Progress Notes (Signed)
07/12/2021 9:54 AM   Marissa Rice [birth year] [id number]  Referring provider: Haroldine Laws, CNM [address] [place],  [place] [address]  Chief Complaint  Patient presents with   Recurrent UTI    HPI: Marissa Rice is a 25 y.o. female referred for evaluation of recurrent UTI.  Began to have recurrent symptoms of frequency, urgency, dysuria and left flank pain August 2022 Had 1 culture positive for E. coli and several culture since that time positive for group B beta-hemolytic strep Does note onset of symptoms within 24 hours after intercourse Seen in the ED 07/08/2021 complaining of bilateral flank pain radiating to the lower abdomen and intermittent lower abdominal pain.  Urine culture at that visit grew strep agalactiae A stone protocol CT was performed which showed no GU abnormalities No febrile UTIs or pyelonephritis   PMH: Past Medical History:  Diagnosis Date   Hypertension    Thyroid disease     Surgical History: History reviewed. No pertinent surgical history.  Home Medications:  Allergies as of 07/12/2021       Reactions   Hydrochlorothiazide Nausea And Vomiting        Medication List        Accurate as of July 12, 2021  9:54 AM. If you have any questions, ask your nurse or doctor.          ALAYCEN 1/35 tablet Generic drug: norethindrone-ethinyl estradiol 1/35 TOME UNA TABLETA TODOS LOS D AS   diazepam 2 MG tablet Commonly known as: Valium Take 1 tablet (2 mg total) by mouth every 8 (eight) hours as needed for anxiety.   hydrOXYzine 25 MG tablet Commonly known as: ATARAX Take 1 tablet (25 mg total) by mouth every 8 (eight) hours as needed for anxiety.   metFORMIN 500 MG tablet Commonly known as: GLUCOPHAGE Take by mouth.   pantoprazole 20 MG tablet Commonly known as: PROTONIX Take by mouth.   spironolactone 25 MG tablet Commonly known as: ALDACTONE Take 1 tablet by mouth daily.        Allergies:  Allergies   Allergen Reactions   Hydrochlorothiazide Nausea And Vomiting    Family History: History reviewed. No pertinent family history.  Social History:  reports that she has been smoking. She has never used smokeless tobacco. She reports current alcohol use. She reports that she does not currently use drugs after having used the following drugs: Marijuana.   Physical Exam: BP (!) 138/98    Pulse 82    Ht 5\' 7"  (1.702 m)    Wt 262 lb (118.8 kg)    BMI 41.04 kg/m   Constitutional:  Alert and oriented, No acute distress. HEENT: [place] AT, moist mucus membranes.  Trachea midline, no masses. Cardiovascular: No clubbing, cyanosis, or edema. Respiratory: Normal respiratory effort, no increased work of breathing. Psychiatric: Normal mood and affect.  Laboratory Data:  Urinalysis Pending   Pertinent Imaging: CT images personally reviewed and interpreted  CT Renal Stone Study  Narrative CLINICAL DATA:  Flank pain.  Kidney stone suspected.  EXAM: CT ABDOMEN AND PELVIS WITHOUT CONTRAST  TECHNIQUE: Multidetector CT imaging of the abdomen and pelvis was performed following the standard protocol without IV contrast.  COMPARISON:  Chest XR, 07/08/2021.  14/05/2021 Abdomen, 08/28/2020.  FINDINGS: Lower chest: No acute abnormality.  Hepatobiliary: Diffuse hepatic parenchymal hypodensity. No focal liver abnormality is seen. Contracted gallbladder. No gallstones, gallbladder wall thickening, or biliary dilatation.  Pancreas: Unremarkable. No pancreatic ductal dilatation or surrounding inflammatory changes.  Spleen: Normal  in size without focal abnormality.  Adrenals/Urinary Tract: Adrenal glands are unremarkable. Kidneys are normal, without renal calculi, focal lesion, or hydronephrosis. Bladder is unremarkable.  Stomach/Bowel: Stomach is within normal limits. Appendix appears normal. See key image. Nonobstructed small bowel. Nondilated colon. Redundant sigmoid. No evidence of bowel wall  thickening, distention, or inflammatory changes.  Vascular/Lymphatic: No significant vascular findings are present. No enlarged abdominal or pelvic lymph nodes.  Reproductive: Prominent bilateral ovarian follicles. See key image. Uterus is normal.  Other: No abdominal wall hernia or abnormality. No abdominopelvic ascites.  Musculoskeletal: L4-5 posterior disc bulge with peripheral calcification. No acute osseous findings.  IMPRESSION: 1. No acute abdominopelvic findings 2. Hepatic steatosis.   Electronically Signed By: Roanna Banning M.D. On: 07/08/2021 08:39   Assessment & Plan:    1. Recurrent UTI Symptoms do appear related to within 24 hours after intercourse Trial postcoital prophylaxis ampicillin 500 mg after intercourse PA follow-up 1 month for symptom recheck   Riki Altes, MD  [facility] [address], [address] [place], [place] [address] [phone]

## 2021-08-23 ENCOUNTER — Ambulatory Visit: Payer: 59 | Admitting: Physician Assistant

## 2021-08-28 DIAGNOSIS — K760 Fatty (change of) liver, not elsewhere classified: Secondary | ICD-10-CM | POA: Insufficient documentation

## 2021-08-28 DIAGNOSIS — E785 Hyperlipidemia, unspecified: Secondary | ICD-10-CM | POA: Insufficient documentation

## 2021-08-28 DIAGNOSIS — E669 Obesity, unspecified: Secondary | ICD-10-CM | POA: Insufficient documentation

## 2021-08-28 NOTE — Progress Notes (Signed)
08/29/2021 10:24 AM   Marissa Rice 31-Dec-1995 [id number]  Referring provider: Linda [place], [place],  [place] [address]  Chief Complaint  Patient presents with   Recurrent UTI   Follow-up   Urological history: 1. rUTI's -Contributing factors of sexually active -CT renal stone study 06/2021 - NED  -documented positive urine cultures over the last year  07/08/2021 Streptococcus agalactiae  07/02/2021 Beta hemolytic Streptococcus  06/14/2021 Beta hemolytic Streptococcus  05/22/2021 Beta hemolytic Streptococcus  03/21/2021 Beta hemolytic Streptococcus  03/13/2021 E.coli -managed with postcoital prophylaxis ampicillin 500 mg p intercourse   HPI: Marissa Rice is a 26 y.o. female who was initially seen by Dr. Bernardo Rice on 07/11/2021 for rUTI's.  She was placed on postcoital ampicillin and scheduled to return in one month for symptom recheck  She contacted her gynecology on 07/19/2021 for urinary frequency.  UA with rare bacteria and urine culture was positive for MUF.    Today, she states that she has not had any urinary tract infections, but she is experiencing vaginal irritation.  She states that the skin on the inside of her labia feels bumpy and itchy.  She has not noted a vaginal discharge.  She states that this area becomes more irritated when she is walking a lot or during sexual intercourse.  Patient denies any modifying or aggravating factors.  Patient denies any gross hematuria, dysuria or suprapubic/flank pain.  Patient denies any fevers, chills, nausea or vomiting.      PMH: Past Medical History:  Diagnosis Date   Hypertension    Thyroid disease     Surgical History: No past surgical history on file.  Home Medications:  Allergies as of 08/29/2021       Reactions   Hydrochlorothiazide Nausea And Vomiting        Medication List        Accurate as of August 29, 2021 10:24 AM. If you have any questions, ask your nurse or  doctor.          ALAYCEN 1/35 tablet Generic drug: norethindrone-ethinyl estradiol 1/35 TOME UNA TABLETA TODOS LOS D AS   ampicillin 500 MG capsule Commonly known as: PRINCIPEN Take 1 capsule by mouth after intercourse.   diazepam 2 MG tablet Commonly known as: Valium Take 1 tablet (2 mg total) by mouth every 8 (eight) hours as needed for anxiety.   hydrOXYzine 25 MG tablet Commonly known as: ATARAX Take 1 tablet (25 mg total) by mouth every 8 (eight) hours as needed for anxiety.   metFORMIN 500 MG tablet Commonly known as: GLUCOPHAGE Take by mouth.   pantoprazole 20 MG tablet Commonly known as: PROTONIX Take by mouth.   spironolactone 25 MG tablet Commonly known as: ALDACTONE Take 1 tablet by mouth daily.   terconazole 0.8 % vaginal cream Commonly known as: TERAZOL 3 Place 1 applicator vaginally at bedtime. Started by: Marissa Council, PA-C        Allergies:  Allergies  Allergen Reactions   Hydrochlorothiazide Nausea And Vomiting    Family History: No family history on file.  Social History:  reports that she has been smoking. She has never used smokeless tobacco. She reports current alcohol use. She reports that she does not currently use drugs after having used the following drugs: Marijuana.  ROS: Pertinent ROS in HPI  Physical Exam: BP 139/87    Pulse 84    Ht $R'5\' 7"'mN$  (1.702 m)    Wt 258 lb (117 kg)  BMI 40.41 kg/m   Constitutional:  Well nourished. Alert and oriented, No acute distress. HEENT: Marissa Rice AT, mask in place.  Trachea midline Cardiovascular: No clubbing, cyanosis, or edema. Respiratory: Normal respiratory effort, no increased work of breathing. GU: No CVA tenderness.  No bladder fullness or masses.  Normal external genitalia, normal pubic hair distribution, no lesions.  Normal urethral meatus, no lesions, no prolapse, no discharge.   No urethral masses, tenderness and/or tenderness. No bladder fullness, tenderness or masses. Erythremic  vagina mucosa, good estrogen effect, no discharge, no lesions, good pelvic support, no cystocele and no rectocele noted.  Anus and perineum are without rashes or lesions.   The internal area of the labial near the clitoral hood is where more of the erythema is concentrated and I do appreciate the little bumps that she is speaking of which appear to be the result of friction with the 2 inner labia's constantly rubbing against each other.  There was also slight discharge. Neurologic: Grossly intact, no focal deficits, moving all 4 extremities. Psychiatric: Normal mood and affect.      Laboratory Data: Lab Results  Component Value Date   WBC 7.9 07/08/2021   HGB 13.0 07/08/2021   HCT 38.6 07/08/2021   MCV 84.1 07/08/2021   PLT 282 07/08/2021    Lab Results  Component Value Date   CREATININE 0.78 07/08/2021    Lab Results  Component Value Date   AST 27 07/08/2021   Lab Results  Component Value Date   ALT 55 (H) 07/08/2021    Urinalysis    Component Value Date/Time   COLORURINE YELLOW (A) 07/08/2021 0513   APPEARANCEUR Clear 07/12/2021 0941   LABSPEC 1.023 07/08/2021 0513   PHURINE 6.0 07/08/2021 0513   GLUCOSEU Negative 07/12/2021 0941   HGBUR NEGATIVE 07/08/2021 0513   BILIRUBINUR Negative 07/12/2021 [place] 07/08/2021 0513   PROTEINUR Negative 07/12/2021 [place] 07/08/2021 0513   NITRITE Negative 07/12/2021 0941   NITRITE NEGATIVE 07/08/2021 0513   LEUKOCYTESUR Negative 07/12/2021 0941   LEUKOCYTESUR NEGATIVE 07/08/2021 0513  I have reviewed the labs.   Pertinent Imaging: CLINICAL DATA:  Flank pain.  Kidney stone suspected.   EXAM: CT ABDOMEN AND PELVIS WITHOUT CONTRAST   TECHNIQUE: Multidetector CT imaging of the abdomen and pelvis was performed following the standard protocol without IV contrast.   COMPARISON:  Chest XR, 07/08/2021.  US Abdomen, 08/28/2020.   FINDINGS: Lower chest: No acute abnormality.    Hepatobiliary: Diffuse hepatic parenchymal hypodensity. No focal liver abnormality is seen. Contracted gallbladder. No gallstones, gallbladder wall thickening, or biliary dilatation.   Pancreas: Unremarkable. No pancreatic ductal dilatation or surrounding inflammatory changes.   Spleen: Normal in size without focal abnormality.   Adrenals/Urinary Tract: Adrenal glands are unremarkable. Kidneys are normal, without renal calculi, focal lesion, or hydronephrosis. Bladder is unremarkable.   Stomach/Bowel: Stomach is within normal limits. Appendix appears normal. See key image. Nonobstructed small bowel. Nondilated colon. Redundant sigmoid. No evidence of bowel wall thickening, distention, or inflammatory changes.   Vascular/Lymphatic: No significant vascular findings are present. No enlarged abdominal or pelvic lymph nodes.   Reproductive: Prominent bilateral ovarian follicles. See key image. Uterus is normal.   Other: No abdominal wall hernia or abnormality. No abdominopelvic ascites.   Musculoskeletal: L4-5 posterior disc bulge with peripheral calcification. No acute osseous findings.   IMPRESSION: 1. No acute abdominopelvic findings 2. Hepatic steatosis.     Electronically Signed   By: Michaelle Birks  M.D.   On: 07/08/2021 08:39  I have independently reviewed the films.  See HPI.    Assessment & Plan:    1. rUTI's - criteria for recurrent UTI has been met with 2 or more infections in 6 months or 3 or greater infections in one year  -Postcoital ampicillin seems to be effective for her -refill sent in for her  2. Vaginal irritation -prescription sent into pharmacy for Terazol cream number 3 days -Advised patient to use condoms and vaginal lubricants during intercourse -Advised to follow-up with gynecology for further management                                             Return in about 3 months (around 11/26/2021) for Symptom recheck.  These notes generated with voice  recognition software. I apologize for typographical errors.  Marissa Council, PA-C  [facility] [address]  [place], [place] [address] [phone]   I spent 30 minutes on the day of the encounter to include pre-visit record review, face-to-face time with the patient, and post-visit ordering of tests.

## 2021-08-29 ENCOUNTER — Other Ambulatory Visit: Payer: Self-pay

## 2021-08-29 ENCOUNTER — Encounter: Payer: Self-pay | Admitting: Urology

## 2021-08-29 ENCOUNTER — Ambulatory Visit: Payer: 59 | Admitting: Urology

## 2021-08-29 VITALS — BP 139/87 | HR 84 | Ht 67.0 in | Wt 258.0 lb

## 2021-08-29 DIAGNOSIS — N898 Other specified noninflammatory disorders of vagina: Secondary | ICD-10-CM

## 2021-08-29 DIAGNOSIS — N390 Urinary tract infection, site not specified: Secondary | ICD-10-CM | POA: Diagnosis not present

## 2021-08-29 MED ORDER — AMPICILLIN 500 MG PO CAPS: ORAL_CAPSULE | ORAL | 0 refills | Status: DC

## 2021-08-29 MED ORDER — TERCONAZOLE 0.8 % VA CREA: 1.0000 | TOPICAL_CREAM | Freq: Every day | VAGINAL | 0 refills | Status: AC

## 2021-11-26 NOTE — Progress Notes (Deleted)
11/27/2021 9:12 PM   Marissa Rice 10-10-1995 [id number]  Referring provider: Linda Hedges, [place],  [place] [id number]  No chief complaint on file.  Urological history: 1. rUTI's -Contributing factors of sexually active -CT renal stone study 06/2021 - NED  -documented positive urine cultures over the last year  07/08/2021 Streptococcus agalactiae  07/02/2021 Beta hemolytic Streptococcus  06/14/2021 Beta hemolytic Streptococcus  05/22/2021 Beta hemolytic Streptococcus  03/21/2021 Beta hemolytic Streptococcus  03/13/2021 E.coli -managed with postcoital prophylaxis ampicillin 500 mg p intercourse   HPI: Marissa Rice is a 26 y.o. female who presents for a 52-month follow-up.    At her visit on 08/29/2021, she was having vaginal irritations and prescribed Terazol.     PMH: Past Medical History:  Diagnosis Date   Hypertension    Thyroid disease     Surgical History: No past surgical history on file.  Home Medications:  Allergies as of 11/27/2021       Reactions   Hydrochlorothiazide Nausea And Vomiting        Medication List        Accurate as of Nov 26, 2021  9:12 PM. If you have any questions, ask your nurse or doctor.          ALAYCEN 1/35 tablet Generic drug: norethindrone-ethinyl estradiol 1/35 TOME UNA TABLETA TODOS LOS D AS   ampicillin 500 MG capsule Commonly known as: PRINCIPEN Take 1 capsule by mouth after intercourse.   diazepam 2 MG tablet Commonly known as: Valium Take 1 tablet (2 mg total) by mouth every 8 (eight) hours as needed for anxiety.   hydrOXYzine 25 MG tablet Commonly known as: ATARAX Take 1 tablet (25 mg total) by mouth every 8 (eight) hours as needed for anxiety.   metFORMIN 500 MG tablet Commonly known as: GLUCOPHAGE Take by mouth.   pantoprazole 20 MG tablet Commonly known as: PROTONIX Take by mouth.   spironolactone 25 MG tablet Commonly known as: ALDACTONE Take 1 tablet by  mouth daily.   terconazole 0.8 % vaginal cream Commonly known as: TERAZOL 3 Place 1 applicator vaginally at bedtime.        Allergies:  Allergies  Allergen Reactions   Hydrochlorothiazide Nausea And Vomiting    Family History: No family history on file.  Social History:  reports that she has been smoking. She has never used smokeless tobacco. She reports current alcohol use. She reports that she does not currently use drugs after having used the following drugs: Marijuana.  ROS: Pertinent ROS in HPI  Physical Exam: There were no vitals taken for this visit.  Constitutional:  Well nourished. Alert and oriented, No acute distress. HEENT:  AT, moist mucus membranes.  Trachea midline, no masses. Cardiovascular: No clubbing, cyanosis, or edema. Respiratory: Normal respiratory effort, no increased work of breathing. GI: Abdomen is soft, non tender, non distended, no abdominal masses. Liver and spleen not palpable.  No hernias appreciated.  Stool sample for occult testing is not indicated.   GU: No CVA tenderness.  No bladder fullness or masses.  *** external genitalia, *** pubic hair distribution, no lesions.  Normal urethral meatus, no lesions, no prolapse, no discharge.   No urethral masses, tenderness and/or tenderness. No bladder fullness, tenderness or masses. *** vagina mucosa, *** estrogen effect, no discharge, no lesions, *** pelvic support, *** cystocele and *** rectocele noted.  No cervical motion tenderness.  Uterus is freely mobile and non-fixed.  No adnexal/parametria masses or tenderness noted.  Anus and perineum are without rashes or lesions.   ***  Skin: No rashes, bruises or suspicious lesions. Lymph: No cervical or inguinal adenopathy. Neurologic: Grossly intact, no focal deficits, moving all 4 extremities. Psychiatric: Normal mood and affect.    Laboratory Data: Color Yellow, Violet, Light Violet, Dark Violet Yellow   Clarity Clear, Other Clear   Specific  Gravity 1.000 - 1.030 1.015   pH, Urine 5.0 - 8.0 6.0   Protein, Urinalysis Negative, Trace mg/dL Negative   Glucose, Urinalysis Negative mg/dL Negative   Ketones, Urinalysis Negative mg/dL Negative   Blood, Urinalysis Negative Negative   Nitrite, Urinalysis Negative Negative   Leukocyte Esterase, Urinalysis Negative Negative   White Blood Cells, Urinalysis None Seen, 0-3 /hpf None Seen   Red Blood Cells, Urinalysis None Seen, 0-3 /hpf None Seen   Bacteria, Urinalysis None Seen /hpf Rare Abnormal    Squamous Epithelial Cells, Urinalysis Rare, Few, None Seen /hpf Rare   Resulting Agency  [facility]  Specimen Collected: 11/19/21 11:19 Last Resulted: 11/19/21 11:43  Received From: [facility]  Result Received: 11/26/21 21:12   Clue Cells, Vaginal None Seen None Seen   Comment: Whiff test negative  WBC (White Blood Cells), Vaginal None Seen Rare Abnormal    Trichomonas, Vaginal None Seen None Seen   Bacteria, Vaginal None Seen Many Abnormal    Yeast, Vaginal None Seen None Seen   Resulting Agency  [facility]  Specimen Collected: 11/19/21 10:52 Last Resulted: 11/19/21 11:22  Received From: [facility]  Result Received: 11/26/21 21:12  I have reviewed the labs.   Pertinent Imaging: No pertinent imaging since last visit     Assessment & Plan:    1. rUTI's - criteria for recurrent UTI has been met with 2 or more infections in 6 months or 3 or greater infections in one year  -Postcoital ampicillin seems to be effective for her -refill sent in for her  2. Vaginal irritation -prescription sent into pharmacy for Terazol cream number 3 days -Advised patient to use condoms and vaginal lubricants during intercourse -Advised to follow-up with gynecology for further management                                             No follow-ups on file.  These notes generated with voice recognition software. I apologize for  typographical errors.  Zara Council, PA-C  [facility] [address]  [place], [place] [address] [phone]

## 2021-11-27 ENCOUNTER — Emergency Department
Admission: EM | Admit: 2021-11-27 | Discharge: 2021-11-27 | Disposition: A | Discharge status: Home / Self Care | Payer: 59 | Attending: Emergency Medicine | Admitting: Emergency Medicine

## 2021-11-27 ENCOUNTER — Encounter: Payer: Self-pay | Admitting: Emergency Medicine

## 2021-11-27 ENCOUNTER — Ambulatory Visit: Payer: 59 | Admitting: Urology

## 2021-11-27 DIAGNOSIS — I10 Essential (primary) hypertension: Secondary | ICD-10-CM | POA: Insufficient documentation

## 2021-11-27 DIAGNOSIS — R42 Dizziness and giddiness: Secondary | ICD-10-CM | POA: Insufficient documentation

## 2021-11-27 DIAGNOSIS — D72829 Elevated white blood cell count, unspecified: Secondary | ICD-10-CM | POA: Insufficient documentation

## 2021-11-27 DIAGNOSIS — R0602 Shortness of breath: Secondary | ICD-10-CM | POA: Insufficient documentation

## 2021-11-27 DIAGNOSIS — R0789 Other chest pain: Secondary | ICD-10-CM | POA: Diagnosis not present

## 2021-11-27 DIAGNOSIS — N898 Other specified noninflammatory disorders of vagina: Secondary | ICD-10-CM

## 2021-11-27 DIAGNOSIS — N390 Urinary tract infection, site not specified: Secondary | ICD-10-CM

## 2021-11-27 LAB — BASIC METABOLIC PANEL
Anion gap: 8 (ref 5–15)
BUN: 14 mg/dL (ref 6–20)
CO2: 23 mmol/L (ref 22–32)
Calcium: 8.9 mg/dL (ref 8.9–10.3)
Chloride: 106 mmol/L (ref 98–111)
Creatinine, Ser: 0.6 mg/dL (ref 0.44–1.00)
GFR, Estimated: >60 mL/min (ref >60)
Glucose, Bld: 129 mg/dL — ABNORMAL HIGH (ref 70–99)
Potassium: 3.8 mmol/L (ref 3.5–5.1)
Sodium: 137 mmol/L (ref 135–145)

## 2021-11-27 LAB — CBC
HCT: 40.8 % (ref 36.0–46.0)
Hemoglobin: 13.3 g/dL (ref 12.0–15.0)
MCH: 27.7 pg (ref 26.0–34.0)
MCHC: 32.6 g/dL (ref 30.0–36.0)
MCV: 85 fL (ref 80.0–100.0)
Platelets: 302 10*3/uL (ref 150–400)
RBC: 4.8 MIL/uL (ref 3.87–5.11)
RDW: 13.4 % (ref 11.5–15.5)
WBC: 12.3 10*3/uL — ABNORMAL HIGH (ref 4.0–10.5)
nRBC: 0 % (ref 0.0–0.2)

## 2021-11-27 LAB — D-DIMER, QUANTITATIVE: D-Dimer, Quant: <0.27 ug/mL-FEU (ref 0.00–0.50)

## 2021-11-27 LAB — URINALYSIS, ROUTINE W REFLEX MICROSCOPIC
Bilirubin Urine: NEGATIVE
Glucose, UA: NEGATIVE mg/dL
Hgb urine dipstick: NEGATIVE
Ketones, ur: NEGATIVE mg/dL
Leukocytes,Ua: NEGATIVE
Nitrite: NEGATIVE
Protein, ur: NEGATIVE mg/dL
Specific Gravity, Urine: 1.024 (ref 1.005–1.030)
pH: 5 (ref 5.0–8.0)

## 2021-11-27 LAB — TROPONIN I (HIGH SENSITIVITY)
Troponin I (High Sensitivity): 3 ng/L (ref <18)
Troponin I (High Sensitivity): 3 ng/L (ref <18)

## 2021-11-27 LAB — POC URINE PREG, ED: Preg Test, Ur: NEGATIVE

## 2021-11-27 NOTE — ED Triage Notes (Signed)
Pt c/o dizziness, SOB, hot flashes after using 2 vaginal creams for first time. Per pt, she was recently diagnosed with BV and was only using 1 cream but this morning she combined with a previous cream pt had for yeast infection. Pt stated 10 minutes after applying is when she began to have symptoms.  ?

## 2021-11-27 NOTE — Discharge Instructions (Signed)
Your workup in the Emergency Department today was reassuring.  We did not find any specific abnormalities.  We recommend you drink plenty of fluids, take your regular medications and/or any new ones prescribed today, and follow up with the doctor(s) listed in these documents as recommended.  Return to the Emergency Department if you develop new or worsening symptoms that concern you.  

## 2021-11-27 NOTE — ED Provider Notes (Signed)
? ?[facility] ?Provider Note ? ? ? Event Date/Time  ? First MD Initiated Contact with Patient 11/27/21 [phone]   ?  (approximate) ? ? ?History  ? ?Dizziness ? ? ?HPI ? ?Marissa Rice is a 26 y.o. female whose medical history includes hypertension, morbid obesity, hyperlipidemia, hyperinsulinemia.  She presents for evaluation of a constellation of symptoms after using 2 different vaginal creams.  She said that she was prescribed 2 medications by her midwife at [facility] OB[facility], clobetasol and an antifungal ointment.  She combined the 2 of them and was afraid that the combination of the 2 might be what made her feel bad.  She said that she felt a little bit lightheaded or dizzy and with a bit of shortness of breath with some central chest pressure.  The symptoms started within minutes of applying the creams and they have almost completely resolved at this point.  Nothing in particular made them better or worse.  She had no fever, nausea, vomiting, nor abdominal pain. ? ? ?Physical Exam  ? ?Triage Vital Signs: ?ED Triage Vitals  ?Enc Vitals Group  ?   BP 11/27/21 0322 (!) 153/97  ?   Pulse Rate 11/27/21 0322 78  ?   Resp 11/27/21 0322 20  ?   Temp 11/27/21 0322 97.9 ?F (36.6 ?C)  ?   Temp Source 11/27/21 0322 Oral  ?   SpO2 11/27/21 0322 96 %  ?   Weight 11/27/21 0352 117 kg (258 lb)  ?   Height 11/27/21 0352 1.702 m (5\' 7" )  ?   Head Circumference --   ?   Peak Flow --   ?   Pain Score --   ?   Pain Loc --   ?   Pain Edu? --   ?   Excl. in GC? --   ? ? ?Most recent vital signs: ?Vitals:  ? 11/27/21 0530 11/27/21 0658  ?BP: 132/83 119/81  ?Pulse: 76 66  ?Resp: 20 20  ?Temp:    ?SpO2: 100% 100%  ? ? ? ?General: Awake, no distress.  ?CV:  Good peripheral perfusion.  Normal heart sounds. ?Resp:  Normal effort.  Lungs are clear to auscultation bilaterally. ?Abd:  No distention.  No tenderness to palpation of the abdomen. ?Other:  No focal neurological deficits.  Moving all 4 extremities,  ambulatory without difficulty, no ataxia, no speech finding difficulties. ? ? ?ED Results / Procedures / Treatments  ? ?Labs ?(all labs ordered are listed, but only abnormal results are displayed) ?Labs Reviewed  ?BASIC METABOLIC PANEL - Abnormal; Notable for the following components:  ?    Result Value  ? Glucose, Bld 129 (*)   ? All other components within normal limits  ?CBC - Abnormal; Notable for the following components:  ? WBC 12.3 (*)   ? All other components within normal limits  ?URINALYSIS, ROUTINE W REFLEX MICROSCOPIC - Abnormal; Notable for the following components:  ? Color, Urine YELLOW (*)   ? APPearance CLOUDY (*)   ? All other components within normal limits  ?D-DIMER, QUANTITATIVE  ?POC URINE PREG, ED  ?CBG MONITORING, ED  ?TROPONIN I (HIGH SENSITIVITY)  ?TROPONIN I (HIGH SENSITIVITY)  ? ? ? ?EKG ? ?ED ECG REPORT ?I07/02/23, the attending physician, personally viewed and interpreted this ECG. ? ?Date: 11/27/2021 ?EKG Time: 3:52 AM ?Rate: 82 ?Rhythm: normal sinus rhythm ?QRS Axis: normal ?Intervals: normal ?ST/T Wave abnormalities: normal ?Narrative Interpretation: no evidence of acute ischemia ? ? ? ?  RADIOLOGY ?No indication for emergent imaging ? ? ? ?PROCEDURES: ? ?Critical Care performed: No ? ?Procedures ? ? ?MEDICATIONS ORDERED IN ED: ?Medications - No data to display ? ? ?IMPRESSION / MDM / ASSESSMENT AND PLAN / ED COURSE  ?I reviewed the triage vital signs and the nursing notes. ?             ?               ? ?Differential diagnosis includes, but is not limited to, anxiety, medication or drug side effect, less likely PE or acute infectious process. ? ?Vital signs are stable and within normal limits with no tachycardia, fever, hypotension, nor hypoxemia.  I reviewed her EKG and there is no evidence of ischemia or arrhythmia or interval abnormality.  Labs ordered initially include CBC, urinalysis, basic metabolic panel, urine pregnancy test, and high-sensitivity troponin. ? ?I reviewed  the results of the lab work.  Her urinalysis is reassuring with no evidence of infection.  CBC is normal other than a very mild and nonspecific leukocytosis.  Basic metabolic panel is essentially normal and within normal limits.  Urine pregnancy test is negative.  Initial high-sensitivity troponin is within normal limits. ? ?I have verified with the patient that she takes birth control pills and therefore is not eligible for the [facility] rule.  Medication or drug side effect seems unlikely to have caused her symptoms, and anxiety over the use of these medications is the most likely culprit.  However the only acute or emergent condition that could lead to similar symptoms would be a pulmonary embolism.  Though she is low risk, she is on birth control pills, so I will obtain a D-dimer to further risk stratify her.  We will also check a second troponin. ? ?The patient is low risk for ACS, low risk for PE, and should be appropriate for discharge and outpatient follow-up if she remains essentially asymptomatic and her additional lab work is normal.  I explained to her the plan regarding the D-dimer, possible CTA chest, and discharged with outpatient follow-up.  She understands and agrees with the plan. ? ? ? ? ?Clinical Course as of 11/27/21 0739  ?Tue Nov 27, 2021  ?9604 I reviewed the results of her second high-sensitivity troponin and her D-dimer.  Both are within normal limits.  The patient is stable and appropriate for discharge and outpatient follow-up.  I updated her and she understands and agrees with the plan and will follow-up with her OB/GYN provider. [CF]  ?  ?Clinical Course User Index ?[CF] Loleta Rose, MD  ? ? ? ?FINAL CLINICAL IMPRESSION(S) / ED DIAGNOSES  ? ?Final diagnoses:  ?Dizziness  ? ? ? ?Rx / DC Orders  ? ?ED Discharge Orders   ? ? None  ? ?  ? ? ? ?Note:  This document was prepared using Dragon voice recognition software and may include unintentional dictation errors. ?  ?Loleta Rose,  MD ?11/27/21 [phone] ? ?

## 2022-02-13 ENCOUNTER — Emergency Department
Admission: EM | Admit: 2022-02-13 | Discharge: 2022-02-13 | Disposition: A | Discharge status: Home / Self Care | Payer: 59 | Attending: Emergency Medicine | Admitting: Emergency Medicine

## 2022-02-13 ENCOUNTER — Other Ambulatory Visit: Payer: Self-pay

## 2022-02-13 ENCOUNTER — Encounter: Payer: Self-pay | Admitting: Emergency Medicine

## 2022-02-13 ENCOUNTER — Emergency Department: Payer: 59

## 2022-02-13 DIAGNOSIS — E876 Hypokalemia: Secondary | ICD-10-CM | POA: Diagnosis not present

## 2022-02-13 DIAGNOSIS — F419 Anxiety disorder, unspecified: Secondary | ICD-10-CM | POA: Diagnosis not present

## 2022-02-13 DIAGNOSIS — I10 Essential (primary) hypertension: Secondary | ICD-10-CM | POA: Insufficient documentation

## 2022-02-13 DIAGNOSIS — Z79899 Other long term (current) drug therapy: Secondary | ICD-10-CM | POA: Insufficient documentation

## 2022-02-13 DIAGNOSIS — R0602 Shortness of breath: Secondary | ICD-10-CM | POA: Diagnosis present

## 2022-02-13 LAB — CBC WITH DIFFERENTIAL/PLATELET
Abs Immature Granulocytes: 0.02 10*3/uL (ref 0.00–0.07)
Basophils Absolute: 0.1 10*3/uL (ref 0.0–0.1)
Basophils Relative: 1 %
Eosinophils Absolute: 0.1 10*3/uL (ref 0.0–0.5)
Eosinophils Relative: 1 %
HCT: 39.9 % (ref 36.0–46.0)
Hemoglobin: 13.4 g/dL (ref 12.0–15.0)
Immature Granulocytes: 0 %
Lymphocytes Relative: 45 %
Lymphs Abs: 4.3 10*3/uL — ABNORMAL HIGH (ref 0.7–4.0)
MCH: 28.3 pg (ref 26.0–34.0)
MCHC: 33.6 g/dL (ref 30.0–36.0)
MCV: 84.2 fL (ref 80.0–100.0)
Monocytes Absolute: 0.7 10*3/uL (ref 0.1–1.0)
Monocytes Relative: 7 %
Neutro Abs: 4.5 10*3/uL (ref 1.7–7.7)
Neutrophils Relative %: 46 %
Platelets: 274 10*3/uL (ref 150–400)
RBC: 4.74 MIL/uL (ref 3.87–5.11)
RDW: 12.6 % (ref 11.5–15.5)
WBC: 9.6 10*3/uL (ref 4.0–10.5)
nRBC: 0 % (ref 0.0–0.2)

## 2022-02-13 LAB — COMPREHENSIVE METABOLIC PANEL
ALT: 63 U/L — ABNORMAL HIGH (ref 0–44)
AST: 38 U/L (ref 15–41)
Albumin: 3.8 g/dL (ref 3.5–5.0)
Alkaline Phosphatase: 52 U/L (ref 38–126)
Anion gap: 8 (ref 5–15)
BUN: 12 mg/dL (ref 6–20)
CO2: 23 mmol/L (ref 22–32)
Calcium: 8.6 mg/dL — ABNORMAL LOW (ref 8.9–10.3)
Chloride: 108 mmol/L (ref 98–111)
Creatinine, Ser: 0.78 mg/dL (ref 0.44–1.00)
GFR, Estimated: >60 mL/min (ref >60)
Glucose, Bld: 108 mg/dL — ABNORMAL HIGH (ref 70–99)
Potassium: 3.3 mmol/L — ABNORMAL LOW (ref 3.5–5.1)
Sodium: 139 mmol/L (ref 135–145)
Total Bilirubin: 0.6 mg/dL (ref 0.3–1.2)
Total Protein: 6.9 g/dL (ref 6.5–8.1)

## 2022-02-13 LAB — TROPONIN I (HIGH SENSITIVITY)
Troponin I (High Sensitivity): 4 ng/L (ref <18)
Troponin I (High Sensitivity): 3 ng/L (ref <18)

## 2022-02-13 LAB — D-DIMER, QUANTITATIVE: D-Dimer, Quant: <0.27 ug/mL-FEU (ref 0.00–0.50)

## 2022-02-13 MED ORDER — LIDOCAINE VISCOUS HCL 2 % MT SOLN
15.0000 mL | Freq: Once | OROMUCOSAL | Status: AC
  Administered 2022-02-13: 15 mL via OROMUCOSAL
  Filled 2022-02-13: qty 15

## 2022-02-13 MED ORDER — POTASSIUM CHLORIDE CRYS ER 20 MEQ PO TBCR
40.0000 meq | EXTENDED_RELEASE_TABLET | Freq: Once | ORAL | Status: AC
  Administered 2022-02-13: 40 meq via ORAL
  Filled 2022-02-13: qty 2

## 2022-02-13 MED ORDER — ALBUTEROL SULFATE HFA 108 (90 BASE) MCG/ACT IN AERS: 2.0000 | INHALATION_SPRAY | RESPIRATORY_TRACT | 0 refills | Status: AC | PRN

## 2022-02-13 MED ORDER — ALUM & MAG HYDROXIDE-SIMETH 200-200-20 MG/5ML PO SUSP
30.0000 mL | Freq: Once | ORAL | Status: AC
Start: 2022-02-13 — End: 2022-02-13
  Administered 2022-02-13: 30 mL via ORAL
  Filled 2022-02-13: qty 30

## 2022-02-13 MED ORDER — FAMOTIDINE IN NACL 20-0.9 MG/50ML-% IV SOLN: 20.0000 mg | Freq: Once | INTRAVENOUS | Status: DC

## 2022-02-13 NOTE — Discharge Instructions (Signed)
Use Albuterol inhaler 2 puffs every 4 hours as needed for breathing difficulty.  Return to the ER for worsening symptoms, persistent vomiting, difficulty breathing or other concerns.

## 2022-02-13 NOTE — ED Provider Notes (Signed)
[facility] Provider Note    Event Date/Time   First MD Initiated Contact with Patient 02/13/22 [phone]     (approximate)   History   Shortness of Breath   HPI  Marissa Rice is a 26 y.o. female who presents to the ED from home with a chief complaint of shortness of breath.  Patient was drifting off to sleep when she awoke suddenly feeling short of breath and anxious with some heartburn.  History of similar.  Denies recent fever, cough, chest pain, abdominal pain, nausea, vomiting or dizziness.  Patient does take OCPs.     Past Medical History   Past Medical History:  Diagnosis Date   Hypertension    Thyroid disease      Active Problem List   Patient Active Problem List   Diagnosis Date Noted   Fatty liver 08/28/2021   Hyperlipidemia, unspecified 08/28/2021   Obesity (BMI 30-39.9) 08/28/2021   Chronic rhinitis 02/15/2021   Frequent headaches 02/15/2021   Essential hypertension 06/16/2019   Hyperinsulinemia 09/09/2017     Past Surgical History  History reviewed. No pertinent surgical history.   Home Medications   Prior to Admission medications   Medication Sig Start Date End Date Taking? Authorizing Provider  albuterol (VENTOLIN HFA) 108 (90 Base) MCG/ACT inhaler Inhale 2 puffs into the lungs every 4 (four) hours as needed for wheezing or shortness of breath. 02/13/22  Yes Irean Hong, MD  ALAYCEN 1/35 tablet TOME UNA TABLETA TODOS LOS D AS 05/14/21   [provider]  ampicillin (PRINCIPEN) 500 MG capsule Take 1 capsule by mouth after intercourse. 08/29/21   Michiel Cowboy A, PA-C  diazepam (VALIUM) 2 MG tablet Take 1 tablet (2 mg total) by mouth every 8 (eight) hours as needed for anxiety. 03/03/21 03/03/22  Willy Eddy, MD  hydrOXYzine (ATARAX/VISTARIL) 25 MG tablet Take 1 tablet (25 mg total) by mouth every 8 (eight) hours as needed for anxiety. 07/30/20   Tommie Sams, DO  metFORMIN (GLUCOPHAGE) 500 MG tablet Take by mouth.  09/21/20 09/21/21  [provider]  pantoprazole (PROTONIX) 20 MG tablet Take by mouth. 05/14/21 08/12/21  [provider]  spironolactone (ALDACTONE) 25 MG tablet Take 1 tablet by mouth daily. 05/14/21   [provider]  terconazole (TERAZOL 3) 0.8 % vaginal cream Place 1 applicator vaginally at bedtime. 08/29/21   Michiel Cowboy A, PA-C     Allergies  Hydrochlorothiazide   Family History  No family history on file.   Physical Exam  Triage Vital Signs: ED Triage Vitals  Enc Vitals Group     BP 02/13/22 0417 (!) 143/94     Pulse Rate 02/13/22 0417 77     Resp 02/13/22 0417 20     Temp 02/13/22 0417 97.9 F (36.6 C)     Temp Source 02/13/22 0417 Oral     SpO2 02/13/22 0417 98 %     Weight 02/13/22 0414 260 lb (117.9 kg)     Height 02/13/22 0414 5\' 7"  (1.702 m)     Head Circumference --      Peak Flow --      Pain Score 02/13/22 0414 0     Pain Loc --      Pain Edu? --      Excl. in GC? --     Updated Vital Signs: BP (!) 128/95   Pulse 70   Temp 97.9 F (36.6 C) (Oral)   Resp 20   Ht  5\' 7"  (1.702 m)   Wt 117.9 kg   LMP 01/30/2022 (Exact Date)   SpO2 97%   BMI 40.72 kg/m    General: Awake, no distress.  CV:  RRR.  Good peripheral perfusion.  Resp:  Normal effort.  CTAB. Abd:  Nontender.  No distention.  Other:  Mildly anxious.  Bilateral calves are nonswollen and nontender.   ED Results / Procedures / Treatments  Labs (all labs ordered are listed, but only abnormal results are displayed) Labs Reviewed  CBC WITH DIFFERENTIAL/PLATELET - Abnormal; Notable for the following components:      Result Value   Lymphs Abs 4.3 (*)    All other components within normal limits  COMPREHENSIVE METABOLIC PANEL - Abnormal; Notable for the following components:   Potassium 3.3 (*)    Glucose, Bld 108 (*)    Calcium 8.6 (*)    ALT 63 (*)    All other components within normal limits  D-DIMER, QUANTITATIVE  TROPONIN I (HIGH SENSITIVITY)   TROPONIN I (HIGH SENSITIVITY)     EKG  ED ECG REPORT I, Shihab States J, the attending physician, personally viewed and interpreted this ECG.   Date: 02/13/2022  EKG Time: 0417  Rate: 79  Rhythm: normal sinus rhythm  Axis: Normal  Intervals:none  ST&T Change: Nonspecific    RADIOLOGY I have independently visualized and interpreted patient's chest x-ray as well as noted the radiology interpretation:  Chest X-ray: No acute cardiopulmonary process  Official radiology report(s): DG Chest 2 View  Result Date: 02/13/2022 CLINICAL DATA:  Shortness of breath EXAM: CHEST - 2 VIEW COMPARISON:  07/08/2021 FINDINGS: Normal heart size and mediastinal contours. No acute infiltrate or edema. No effusion or pneumothorax. No acute osseous findings. IMPRESSION: Negative chest. Electronically Signed   By: 14/05/2021 M.D.   On: 02/13/2022 04:44     PROCEDURES:  Critical Care performed: No  .1-3 Lead EKG Interpretation  Performed by: 02/15/2022, MD Authorized by: Irean Hong, MD     Interpretation: normal     ECG rate:  80   ECG rate assessment: normal     Rhythm: sinus rhythm     Ectopy: none     Conduction: normal   Comments:     Patient placed on cardiac monitor to evaluate for arrhythmias    MEDICATIONS ORDERED IN ED: Medications  potassium chloride SA (KLOR-CON M) CR tablet 40 mEq (40 mEq Oral Given 02/13/22 0615)  alum & mag hydroxide-simeth (MAALOX/MYLANTA) 200-200-20 MG/5ML suspension 30 mL (30 mLs Oral Given 02/13/22 0616)  lidocaine (XYLOCAINE) 2 % viscous mouth solution 15 mL (15 mLs Mouth/Throat Given 02/13/22 0616)     IMPRESSION / MDM / ASSESSMENT AND PLAN / ED COURSE  I reviewed the triage vital signs and the nursing notes.                             26 year old female presenting with shortness of breath and anxiety. Differential includes, but is not limited to, viral syndrome, bronchitis including COPD exacerbation, pneumonia, reactive airway disease  including asthma, CHF including exacerbation with or without pulmonary/interstitial edema, pneumothorax, ACS, thoracic trauma, and pulmonary embolism.  I have personally reviewed patient's records and see that she was seen in the ED in May of this year for dizziness and last PCP visit in March of this year for hypertension.  Patient's presentation is most consistent with acute complicated illness / injury requiring diagnostic workup.  The patient is on the cardiac monitor to evaluate for evidence of arrhythmia and/or significant heart rate changes.  Laboratory results demonstrate normal WBC 9.6, mild hypokalemia potassium 3.3, initial troponin negative.  Chest x-ray unremarkable.  Will repeat timed troponin, obtain D-dimer.  Administer IV Pepcid as GERD likely contributory factor, replete potassium orally and reassess.  Clinical Course as of 02/13/22 5170  Wed Feb 13, 2022  0607 Out of IV Pepcid; will substitute GI cocktail with lidocaine. [JS]  [id number] Repeat troponin and D-dimer are unremarkable.  Patient feeling better after GI cocktail.  Potassium was repleted orally.  Will discharge home with prescription for albuterol inhaler to use as needed.  Strict return precautions given.  Patient and her mother verbalized understanding and agree with plan of care. [JS]    Clinical Course User Index [JS] Irean Hong, MD     FINAL CLINICAL IMPRESSION(S) / ED DIAGNOSES   Final diagnoses:  Shortness of breath  Anxiety  Hypokalemia     Rx / DC Orders   ED Discharge Orders          Ordered    albuterol (VENTOLIN HFA) 108 (90 Base) MCG/ACT inhaler  Every 4 hours PRN        02/13/22 0641             Note:  This document was prepared using Dragon voice recognition software and may include unintentional dictation errors.   Irean Hong, MD 02/13/22 [phone]

## 2022-02-13 NOTE — ED Notes (Signed)
Pt brought to ED rm 25 at this time, this RN now assuming care. 

## 2022-02-13 NOTE — ED Triage Notes (Signed)
Patient ambulatory to triage with steady gait, without difficulty or distress noted; pt reports awakening PTA with [facility] and anxiety; denies any other accomp symptoms, no recent illness

## 2022-08-06 ENCOUNTER — Ambulatory Visit
Admission: EM | Admit: 2022-08-06 | Discharge: 2022-08-06 | Disposition: A | Discharge status: Home / Self Care | Payer: Self-pay | Attending: Emergency Medicine | Admitting: Emergency Medicine

## 2022-08-06 DIAGNOSIS — N760 Acute vaginitis: Secondary | ICD-10-CM | POA: Insufficient documentation

## 2022-08-06 DIAGNOSIS — M545 Low back pain, unspecified: Secondary | ICD-10-CM | POA: Insufficient documentation

## 2022-08-06 DIAGNOSIS — B9689 Other specified bacterial agents as the cause of diseases classified elsewhere: Secondary | ICD-10-CM | POA: Insufficient documentation

## 2022-08-06 LAB — WET PREP, GENITAL
Sperm: NONE SEEN
Trich, Wet Prep: NONE SEEN
WBC, Wet Prep HPF POC: <10 — AB (ref <10)
Yeast Wet Prep HPF POC: NONE SEEN

## 2022-08-06 LAB — URINALYSIS, ROUTINE W REFLEX MICROSCOPIC
Bilirubin Urine: NEGATIVE
Glucose, UA: NEGATIVE mg/dL
Hgb urine dipstick: NEGATIVE
Ketones, ur: NEGATIVE mg/dL
Leukocytes,Ua: NEGATIVE
Nitrite: NEGATIVE
Protein, ur: NEGATIVE mg/dL
Specific Gravity, Urine: 1.025 (ref 1.005–1.030)
pH: 6 (ref 5.0–8.0)

## 2022-08-06 LAB — PREGNANCY, URINE: Preg Test, Ur: NEGATIVE

## 2022-08-06 MED ORDER — BACLOFEN 10 MG PO TABS: 10.0000 mg | ORAL_TABLET | Freq: Three times a day (TID) | ORAL | 0 refills | Status: DC

## 2022-08-06 MED ORDER — METRONIDAZOLE 500 MG PO TABS: 500.0000 mg | ORAL_TABLET | Freq: Two times a day (BID) | ORAL | 0 refills | Status: DC

## 2022-08-06 MED ORDER — IBUPROFEN 600 MG PO TABS: 600.0000 mg | ORAL_TABLET | Freq: Four times a day (QID) | ORAL | 0 refills | Status: DC | PRN

## 2022-08-06 NOTE — Discharge Instructions (Addendum)
Take the ibuprofen, 600 mg every 6 hours with food, on a schedule for the next 48 hours and then as needed.  Take the baclofen, 10 mg every 8 hours, on a schedule for the next 48 hours and then as needed.  Apply moist heat to your back for 30 minutes at a time 2-3 times a day to improve blood flow to the area and help remove the lactic acid causing the spasm.  Follow the back exercises given at discharge.  Take the Flagyl (metronidazole) 500 mg twice daily for treatment of your bacterial vaginosis.  Avoid alcohol while on the metronidazole as taken together will cause of vomiting.  Bacterial vaginosis is often caused by a imbalance of bacteria in your vaginal vault.  This is sometimes a result of using tampons or hormonal fluctuations during her menstrual cycle.  You if your symptoms are recurrent you can try using a boric acid suppository twice weekly to help maintain the acid-base balance in your vagina vault which could prevent further infection.  You can also try vaginal probiotics to help return normal bacterial balance.   Return for reevaluation for any new or worsening symptoms.

## 2022-08-06 NOTE — ED Provider Notes (Signed)
[facility]    CSN: [id number] Arrival date & time: 08/06/22  1530      History   Chief Complaint Chief Complaint  Patient presents with   Back Pain    HPI Marissa Rice is a 27 y.o. female.   HPI  27 year old female here for evaluation of low back pain.  The patient reports that she has been experiencing pain in her low back for the last several days.  The pain does increase when she bends over or with movement but she does not remember any particular activity that may have triggered her pain.  She states that she has a history of UTIs and she has a concern that she may have a UTI so she would like to be checked.  She denies any pain with urination or urinary urgency or frequency.  She denies any blood in her urine or cloudiness to her urine.  She also not had a fever.  She has had some mild nausea and she also reports that she has had some vaginal itching but denies discharge.  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 No pertinent surgical history.  OB History     Gravida  0   Para  0   Term  0   Preterm  0   AB  0   Living  0      SAB  0   IAB  0   Ectopic  0   Multiple  0   Live Births               Home Medications    Prior to Admission medications   Medication Sig Start Date End Date Taking? Authorizing Provider  ALAYCEN 1/35 tablet TOME UNA TABLETA TODOS LOS D AS 05/14/21  Yes [provider]  albuterol (VENTOLIN HFA) 108 (90 Base) MCG/ACT inhaler Inhale 2 puffs into the lungs every 4 (four) hours as needed for wheezing or shortness of breath. 02/13/22  Yes Paulette Blanch, MD   baclofen (LIORESAL) 10 MG tablet Take 1 tablet (10 mg total) by mouth 3 (three) times daily. 08/06/22  Yes Margarette Canada, NP  ibuprofen (ADVIL) 600 MG tablet Take 1 tablet (600 mg total) by mouth every 6 (six) hours as needed. 08/06/22  Yes Margarette Canada, NP  metFORMIN (GLUCOPHAGE) 500 MG tablet Take by mouth. 09/21/20 08/06/22 Yes [provider]  metroNIDAZOLE (FLAGYL) 500 MG tablet Take 1 tablet (500 mg total) by mouth 2 (two) times daily. 08/06/22  Yes Margarette Canada, NP  pantoprazole (PROTONIX) 20 MG tablet Take by mouth. 05/14/21 08/06/22 Yes [provider]  spironolactone (ALDACTONE) 25 MG tablet Take 1 tablet by mouth daily. 05/14/21  Yes [provider]  terconazole (TERAZOL 3) 0.8 % vaginal cream Place 1 applicator vaginally at bedtime. 08/29/21  Yes McGowan, Hunt Oris, PA-C    Family History History reviewed. No pertinent family history.  Social History Social History   Tobacco Use   Smoking status: Every Day   Smokeless tobacco: Never  Vaping Use   Vaping Use: Never used  Substance Use Topics   Alcohol use: Yes   Drug  use: Not Currently    Types: Marijuana     Allergies   Amlodipine and Hydrochlorothiazide   Review of Systems Review of Systems  Constitutional:  Negative for fever.  Gastrointestinal:  Positive for nausea. Negative for vomiting.  Genitourinary:  Positive for vaginal pain. Negative for dysuria, frequency, hematuria, urgency and vaginal discharge.  Musculoskeletal:  Positive for back pain.  Hematological: Negative.   Psychiatric/Behavioral: Negative.       Physical Exam Triage Vital Signs ED Triage Vitals  Enc Vitals Group     BP 08/06/22 1616 (!) 136/98     Pulse Rate 08/06/22 1616 83     Resp 08/06/22 1616 16     Temp 08/06/22 1616 98.7 F (37.1 C)     Temp Source 08/06/22 1616 Oral     SpO2 08/06/22 1616 94 %     Weight 08/06/22 1615 250 lb (113.4 kg)     Height 08/06/22 1615 5\' 7"  (1.702 m)     Head Circumference --       Peak Flow --      Pain Score 08/06/22 1614 6     Pain Loc --      Pain Edu? --      Excl. in GC? --    No data found.  Updated Vital Signs BP (!) 136/98 (BP Location: Left Arm)   Pulse 83   Temp 98.7 F (37.1 C) (Oral)   Resp 16   Ht 5\' 7"  (1.702 m)   Wt 250 lb (113.4 kg)   LMP 07/01/2022 (Approximate)   SpO2 94%   BMI 39.16 kg/m   Visual Acuity Right Eye Distance:   Left Eye Distance:   Bilateral Distance:    Right Eye Near:   Left Eye Near:    Bilateral Near:     Physical Exam Vitals and nursing note reviewed.  Constitutional:      Appearance: Normal appearance. She is obese. She is not ill-appearing.  HENT:     Head: Normocephalic and atraumatic.  Cardiovascular:     Rate and Rhythm: Normal rate and regular rhythm.     Pulses: Normal pulses.     Heart sounds: Normal heart sounds. No murmur heard.    No friction rub. No gallop.  Pulmonary:     Effort: Pulmonary effort is normal.     Breath sounds: Normal breath sounds. No wheezing, rhonchi or rales.  Abdominal:     Tenderness: There is no right CVA tenderness or left CVA tenderness.  Musculoskeletal:        General: Tenderness present. No swelling.  Skin:    General: Skin is warm.     Capillary Refill: Capillary refill takes less than 2 seconds.  Neurological:     General: No focal deficit present.     Mental Status: She is alert and oriented to person, place, and time.  Psychiatric:        Mood and Affect: Mood normal.        Behavior: Behavior normal.        Thought Content: Thought content normal.        Judgment: Judgment normal.      UC Treatments / Results  Labs (all labs ordered are listed, but only abnormal results are displayed) Labs Reviewed  WET PREP, GENITAL - Abnormal; Notable for the following components:      Result Value   Clue Cells Wet Prep HPF POC PRESENT (*)    WBC, Wet Prep HPF POC <10 (*)  All other components within normal limits  URINALYSIS, ROUTINE W REFLEX  MICROSCOPIC - Abnormal; Notable for the following components:   APPearance HAZY (*)    All other components within normal limits  PREGNANCY, URINE    EKG   Radiology No results found.  Procedures Procedures (including critical care time)  Medications Ordered in UC Medications - No data to display  Initial Impression / Assessment and Plan / UC Course  I have reviewed the triage vital signs and the nursing notes.  Pertinent labs & imaging results that were available during my care of the patient were reviewed by me and considered in my medical decision making (see chart for details).   Patient is a nontoxic-appearing 28 year old female here for evaluation of low back pain with some associated nausea and also vaginal itching.  She reports that she has a history of UTIs but denies any urinary symptoms though she would like to be checked for UTI today.  The pain is in her low back and it does increase with bending over and certain movements.  No precipitating event that patient is aware of.  She does have bilateral lower lumbar paraspinous tenderness but no overt spasm.  No midline tenderness.  I will order urinalysis and vaginal wet prep look for the presence of UTI, bacterial vaginosis, or vaginal yeast infection which could be causing pelvic inflammation and triggering the patient's back pain.  Urinalysis shows hazy appearance but is negative for protein, leukocyte esterase, or nitrites.  Urine pregnancy test is negative.  Vaginal wet prep is positive for clue cells.  I suspect that the patient's back pain is in part coming from her bacterial vaginosis but also has a musculoskeletal component.  I will treat her bacterial vaginosis with metronidazole 500 mg twice daily for 7 days.  I will also prescribe 60 mg of ibuprofen and 10 mg of baclofen to help with back pain and spasm.   Final Clinical Impressions(s) / UC Diagnoses   Final diagnoses:  BV (bacterial vaginosis)  Acute  bilateral low back pain without sciatica     Discharge Instructions      Take the ibuprofen, 600 mg every 6 hours with food, on a schedule for the next 48 hours and then as needed.  Take the baclofen, 10 mg every 8 hours, on a schedule for the next 48 hours and then as needed.  Apply moist heat to your back for 30 minutes at a time 2-3 times a day to improve blood flow to the area and help remove the lactic acid causing the spasm.  Follow the back exercises given at discharge.  Take the Flagyl (metronidazole) 500 mg twice daily for treatment of your bacterial vaginosis.  Avoid alcohol while on the metronidazole as taken together will cause of vomiting.  Bacterial vaginosis is often caused by a imbalance of bacteria in your vaginal vault.  This is sometimes a result of using tampons or hormonal fluctuations during her menstrual cycle.  You if your symptoms are recurrent you can try using a boric acid suppository twice weekly to help maintain the acid-base balance in your vagina vault which could prevent further infection.  You can also try vaginal probiotics to help return normal bacterial balance.   Return for reevaluation for any new or worsening symptoms.      ED Prescriptions     Medication Sig Dispense Auth. Provider   ibuprofen (ADVIL) 600 MG tablet Take 1 tablet (600 mg total) by mouth every 6 (six)  hours as needed. 30 tablet Becky Augusta, NP   baclofen (LIORESAL) 10 MG tablet Take 1 tablet (10 mg total) by mouth 3 (three) times daily. 30 each Becky Augusta, NP   metroNIDAZOLE (FLAGYL) 500 MG tablet Take 1 tablet (500 mg total) by mouth 2 (two) times daily. 14 tablet Becky Augusta, NP      PDMP not reviewed this encounter.   Becky Augusta, NP 08/06/22 1734

## 2022-08-06 NOTE — ED Triage Notes (Signed)
Pt c/o lower back pain, denies any trauma,injury or urinary sx's at this time.

## 2022-11-28 ENCOUNTER — Ambulatory Visit
Admission: EM | Admit: 2022-11-28 | Discharge: 2022-11-28 | Disposition: A | Discharge status: Home / Self Care | Payer: Medicaid Other | Attending: Family Medicine | Admitting: Family Medicine

## 2022-11-28 DIAGNOSIS — N898 Other specified noninflammatory disorders of vagina: Secondary | ICD-10-CM | POA: Diagnosis not present

## 2022-11-28 DIAGNOSIS — B3731 Acute candidiasis of vulva and vagina: Secondary | ICD-10-CM

## 2022-11-28 LAB — WET PREP, GENITAL
Clue Cells Wet Prep HPF POC: NONE SEEN
Sperm: NONE SEEN
Trich, Wet Prep: NONE SEEN
WBC, Wet Prep HPF POC: <10 (ref <10)

## 2022-11-28 LAB — URINALYSIS, MICROSCOPIC (REFLEX): WBC, UA: NONE SEEN WBC/hpf (ref 0–5)

## 2022-11-28 LAB — URINALYSIS, ROUTINE W REFLEX MICROSCOPIC
Bilirubin Urine: NEGATIVE
Glucose, UA: NEGATIVE mg/dL
Ketones, ur: NEGATIVE mg/dL
Leukocytes,Ua: NEGATIVE
Nitrite: NEGATIVE
Protein, ur: NEGATIVE mg/dL
Specific Gravity, Urine: 1.015 (ref 1.005–1.030)
pH: 5.5 (ref 5.0–8.0)

## 2022-11-28 MED ORDER — FLUCONAZOLE 150 MG PO TABS: 150.0000 mg | ORAL_TABLET | ORAL | 0 refills | Status: AC

## 2022-11-28 NOTE — Discharge Instructions (Addendum)
You urine did not indicate that you had a urinary tract infection/UTI.  I sent your urine for cultures of someone contact you to let you know you need to start antibiotics your culture was positive.  You did have evidence of a yeast infection.  Stop at the pharmacy to pick up your yeast infection medication.  Take 1 tablet today wait 3 days and take the second tablet.

## 2022-11-28 NOTE — ED Triage Notes (Signed)
Pt reports she has been feeling uncomfortable in her her vagina. She is having low back pain, burning when urinating,  blood in urine (not on MP), nausea, low abdominal pain, and discharge x 3 weeks.

## 2022-11-28 NOTE — ED Provider Notes (Signed)
[facility]    CSN: [id number] Arrival date & time: 11/28/22  4098      History   Chief Complaint No chief complaint on file.    HPI HPI Marissa Rice is a 27 y.o. female.    Marissa Rice presents for vaginal irritation, vaginal discharge and lower abdominal discomfort .  Tried nothing prior to arrival.  Has not had any antibiotics in last 30 days.   Denies known STI exposure.   Reports no symptoms in her partner. She is  not currently pregnant.  Patient's last menstrual period was 11/17/2022.   - Abnormal vaginal discharge: white and thick  - vaginal bleeding: no - Dysuria: no - Hematuria: no - Urinary urgency: no - Urinary frequency: no  - Fever: no - Abdominal pain yes  - Pelvic pain: yes  - Rash/Skin lesions/mouth ulcers: no - Nausea: yes - Vomiting: no  - Back Pain: yes  - Headache: no       Past Medical History:  Diagnosis Date   Hypertension    Thyroid disease     Patient Active Problem List   Diagnosis Date Noted   Fatty liver 08/28/2021   Hyperlipidemia, unspecified 08/28/2021   Obesity (BMI 30-39.9) 08/28/2021   Chronic rhinitis 02/15/2021   Frequent headaches 02/15/2021   Essential hypertension 06/16/2019   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 No pertinent surgical history.  OB History     Gravida  0   Para  0   Term  0   Preterm  0   AB  0   Living  0      SAB  0   IAB  0   Ectopic  0   Multiple  0   Live Births               Home Medications    Prior to Admission medications   Medication Sig Start Date End Date Taking? Authorizing Provider  fluconazole (DIFLUCAN) 150 MG tablet Take 1 tablet (150 mg total) by mouth every 3 (three) days for 2 doses. 11/28/22 12/02/22 Yes Dayden Viverette, Seward Meth, DO  ALAYCEN 1/35 tablet TOME UNA TABLETA TODOS LOS D AS 05/14/21   [provider]  albuterol (VENTOLIN HFA) 108 (90 Base) MCG/ACT inhaler Inhale 2 puffs into the lungs every 4 (four) hours  as needed for wheezing or shortness of breath. 02/13/22   Irean Hong, MD  baclofen (LIORESAL) 10 MG tablet Take 1 tablet (10 mg total) by mouth 3 (three) times daily. 08/06/22   Becky Augusta, NP  ibuprofen (ADVIL) 600 MG tablet Take 1 tablet (600 mg total) by mouth every 6 (six) hours as needed. 08/06/22   Becky Augusta, NP  metFORMIN (GLUCOPHAGE) 500 MG tablet Take by mouth. 09/21/20 08/06/22  [provider]  metroNIDAZOLE (FLAGYL) 500 MG tablet Take 1 tablet (500 mg total) by mouth 2 (two) times daily. 08/06/22   Becky Augusta, NP  pantoprazole (PROTONIX) 20 MG tablet Take by mouth. 05/14/21 08/06/22  [provider]  spironolactone (ALDACTONE) 25 MG tablet Take 1 tablet by mouth daily. 05/14/21   [provider]  terconazole (TERAZOL 3) 0.8 % vaginal cream Place 1 applicator vaginally at bedtime. 08/29/21   Harle Battiest, PA-C    Family History History reviewed. No pertinent family history.  Social History Social History   Tobacco Use   Smoking status: Every Day   Smokeless tobacco: Never  Vaping Use   Vaping Use: Never used  Substance  Use Topics   Alcohol use: Yes   Drug use: Not Currently    Types: Marijuana     Allergies   Amlodipine and Hydrochlorothiazide   Review of Systems Review of Systems: :negative unless otherwise stated in HPI.      Physical Exam Triage Vital Signs ED Triage Vitals  Enc Vitals Group     BP 11/28/22 0855 138/88     Pulse Rate 11/28/22 0855 76     Resp 11/28/22 0855 18     Temp 11/28/22 0855 97.8 F (36.6 C)     Temp Source 11/28/22 0855 Oral     SpO2 11/28/22 0855 99 %     Weight --      Height --      Head Circumference --      Peak Flow --      Pain Score 11/28/22 0901 2     Pain Loc --      Pain Edu? --      Excl. in GC? --    No data found.  Updated Vital Signs BP 138/88 (BP Location: Left Arm)   Pulse 76   Temp 97.8 F (36.6 C) (Oral)   Resp 18   LMP 11/17/2022   SpO2 99%   Visual Acuity Right  Eye Distance:   Left Eye Distance:   Bilateral Distance:    Right Eye Near:   Left Eye Near:    Bilateral Near:     Physical Exam GEN: well appearing female in no acute distress  CVS: well perfused  RESP: speaking in full sentences without pause  ABD: soft, non-tender, non-distended, no palpable masses   GU: deferred, patient performed self swab     UC Treatments / Results  Labs (all labs ordered are listed, but only abnormal results are displayed) Labs Reviewed  WET PREP, GENITAL - Abnormal; Notable for the following components:      Result Value   Yeast Wet Prep HPF POC PRESENT (*)    All other components within normal limits  URINALYSIS, ROUTINE W REFLEX MICROSCOPIC - Abnormal; Notable for the following components:   Hgb urine dipstick TRACE (*)    All other components within normal limits  URINALYSIS, MICROSCOPIC (REFLEX) - Abnormal; Notable for the following components:   Bacteria, UA FEW (*)    All other components within normal limits  URINE CULTURE    EKG   Radiology No results found.  Procedures Procedures (including critical care time)  Medications Ordered in UC Medications - No data to display  Initial Impression / Assessment and Plan / UC Course  I have reviewed the triage vital signs and the nursing notes.  Pertinent labs & imaging results that were available during my care of the patient were reviewed by me and considered in my medical decision making (see chart for details).      Patient is a 27 y.o.Marland Kitchen female  who presents for vaginal irritation.  Overall patient is well-appearing and afebrile.  Vital signs stable.  UA not consistent with acute cystitis. Urine culture obtained.  Follow-up sensitivities and start antibiotics, if needed.She has some yeast seen on urinalysis. Yeast vaginitis confirmed on wet prep.  Diflucan for 2 doses for yeast infection   Return precautions including abdominal pain, fever, chills, nausea, or vomiting given.  Discussed MDM, treatment plan and plan for follow-up with patient who agrees with plan.          Final Clinical Impressions(s) / UC Diagnoses   Final  diagnoses:  Vaginal irritation  Yeast vaginitis     Discharge Instructions      You urine did not indicate that you had a urinary tract infection/UTI.  I sent your urine for cultures of someone contact you to let you know you need to start antibiotics your culture was positive.  You did have evidence of a yeast infection.  Stop at the pharmacy to pick up your yeast infection medication.  Take 1 tablet today wait 3 days and take the second tablet.     ED Prescriptions     Medication Sig Dispense Auth. Provider   fluconazole (DIFLUCAN) 150 MG tablet Take 1 tablet (150 mg total) by mouth every 3 (three) days for 2 doses. 2 tablet Katha Cabal, DO      PDMP not reviewed this encounter.   Katha Cabal, DO 11/28/22 [phone]

## 2022-12-26 ENCOUNTER — Encounter: Payer: Self-pay | Admitting: Emergency Medicine

## 2022-12-26 ENCOUNTER — Emergency Department
Admission: EM | Admit: 2022-12-26 | Discharge: 2022-12-26 | Disposition: A | Discharge status: Home / Self Care | Payer: Medicaid Other | Attending: Emergency Medicine | Admitting: Emergency Medicine

## 2022-12-26 DIAGNOSIS — K625 Hemorrhage of anus and rectum: Secondary | ICD-10-CM

## 2022-12-26 DIAGNOSIS — K649 Unspecified hemorrhoids: Secondary | ICD-10-CM | POA: Diagnosis not present

## 2022-12-26 DIAGNOSIS — I10 Essential (primary) hypertension: Secondary | ICD-10-CM | POA: Diagnosis not present

## 2022-12-26 LAB — URINALYSIS, ROUTINE W REFLEX MICROSCOPIC
Bilirubin Urine: NEGATIVE
Glucose, UA: NEGATIVE mg/dL
Hgb urine dipstick: NEGATIVE
Ketones, ur: NEGATIVE mg/dL
Leukocytes,Ua: NEGATIVE
Nitrite: NEGATIVE
Protein, ur: NEGATIVE mg/dL
Specific Gravity, Urine: 1.024 (ref 1.005–1.030)
pH: 5 (ref 5.0–8.0)

## 2022-12-26 LAB — COMPREHENSIVE METABOLIC PANEL
ALT: 54 U/L — ABNORMAL HIGH (ref 0–44)
AST: 40 U/L (ref 15–41)
Albumin: 4 g/dL (ref 3.5–5.0)
Alkaline Phosphatase: 53 U/L (ref 38–126)
Anion gap: 6 (ref 5–15)
BUN: 12 mg/dL (ref 6–20)
CO2: 24 mmol/L (ref 22–32)
Calcium: 9 mg/dL (ref 8.9–10.3)
Chloride: 106 mmol/L (ref 98–111)
Creatinine, Ser: 0.72 mg/dL (ref 0.44–1.00)
GFR, Estimated: >60 mL/min (ref >60)
Glucose, Bld: 130 mg/dL — ABNORMAL HIGH (ref 70–99)
Potassium: 4.1 mmol/L (ref 3.5–5.1)
Sodium: 136 mmol/L (ref 135–145)
Total Bilirubin: 0.6 mg/dL (ref 0.3–1.2)
Total Protein: 6.9 g/dL (ref 6.5–8.1)

## 2022-12-26 LAB — CBC
HCT: 41.6 % (ref 36.0–46.0)
Hemoglobin: 14 g/dL (ref 12.0–15.0)
MCH: 28.2 pg (ref 26.0–34.0)
MCHC: 33.7 g/dL (ref 30.0–36.0)
MCV: 83.9 fL (ref 80.0–100.0)
Platelets: 272 10*3/uL (ref 150–400)
RBC: 4.96 MIL/uL (ref 3.87–5.11)
RDW: 13.1 % (ref 11.5–15.5)
WBC: 8 10*3/uL (ref 4.0–10.5)
nRBC: 0 % (ref 0.0–0.2)

## 2022-12-26 LAB — LIPASE, BLOOD: Lipase: 38 U/L (ref 11–51)

## 2022-12-26 NOTE — ED Provider Notes (Signed)
[facility] Provider Note    Event Date/Time   First MD Initiated Contact with Patient 12/26/22 1345     (approximate)   History   Hemorrhoids and Abdominal Pain   HPI  Marissa Rice is a 27 y.o. female day history of hypertension and thyroid disease who presents with active bleeding since yesterday occurring with bowel movements and describes bright red blood.  The patient reports some mild bilateral lower abdominal discomfort over the last few days.  She previously had been having some diarrhea but over the last several days has been having harder stools and has been straining.  She denies any prior history of GI bleed.  She has no nausea or vomiting.  She denies feeling dizzy or lightheaded.  I reviewed the past medical records.  The patient's most recent prior encounter was at [facility] urgent care on 5/2 for vaginal discharge.   Physical Exam   Triage Vital Signs: ED Triage Vitals  Enc Vitals Group     BP 12/26/22 1309 (!) 148/93     Pulse Rate 12/26/22 1309 83     Resp 12/26/22 1309 18     Temp 12/26/22 1309 98.2 F (36.8 C)     Temp Source 12/26/22 1309 Oral     SpO2 12/26/22 1309 96 %     Weight 12/26/22 1305 250 lb (113.4 kg)     Height --      Head Circumference --      Peak Flow --      Pain Score 12/26/22 1305 3     Pain Loc --      Pain Edu? --      Excl. in GC? --     Most recent vital signs: Vitals:   12/26/22 1309  BP: (!) 148/93  Pulse: 83  Resp: 18  Temp: 98.2 F (36.8 C)  SpO2: 96%     General: Awake, no distress.  CV:  Good peripheral perfusion.  Resp:  Normal effort.  Abd:  Soft and nontender.  No distention.  Other:  Small non-thrombosed hemorrhoid with no stigmata of recent bleeding.  No other abnormalities on external rectal exam.   ED Results / Procedures / Treatments   Labs (all labs ordered are listed, but only abnormal results are displayed) Labs Reviewed  COMPREHENSIVE METABOLIC PANEL - Abnormal;  Notable for the following components:      Result Value   Glucose, Bld 130 (*)    ALT 54 (*)    All other components within normal limits  URINALYSIS, ROUTINE W REFLEX MICROSCOPIC - Abnormal; Notable for the following components:   Color, Urine YELLOW (*)    APPearance HAZY (*)    All other components within normal limits  LIPASE, BLOOD  CBC     EKG     RADIOLOGY    PROCEDURES:  Critical Care performed: No  Procedures   MEDICATIONS ORDERED IN ED: Medications - No data to display   IMPRESSION / MDM / ASSESSMENT AND PLAN / ED COURSE  I reviewed the triage vital signs and the nursing notes.  27 year old female with no active medical problems presents with small quantity of bright red blood in the stool since yesterday.  On exam her vital signs are normal, she is comfortable appearing, and the abdomen is soft and nontender.  External rectal exam shows no acute abnormalities.  CBC shows normal hemoglobin.  CMP is also normal.  UA shows no acute findings.  Differential diagnosis includes,  but is not limited to, internal hemorrhoid, bleeding related to constipation/hard stools.  There is no evidence of diverticulitis or other acute inflammatory process.  Given the reassuring labs and benign abdominal exam there is no indication for imaging at this time.  There is no evidence of significant blood loss or indication for serial hemoglobins.  Patient's presentation is most consistent with acute complicated illness / injury requiring diagnostic workup.  I counseled the patient on the results of the lab workup and my examination.  She is stable for discharge home at this time.  I have given her general surgery follow-up and she will also follow-up with her PMD.  I have advised her on taking stool softeners.  I gave her strict return precautions and she expressed understanding.   FINAL CLINICAL IMPRESSION(S) / ED DIAGNOSES   Final diagnoses:  Rectal bleeding     Rx / DC  Orders   ED Discharge Orders     None        Note:  This document was prepared using Dragon voice recognition software and may include unintentional dictation errors.    Dionne Bucy, MD 12/26/22 [phone]

## 2022-12-26 NOTE — ED Triage Notes (Addendum)
Pt complains of rectal pain with bleeding started yesterday after having a BM. States blood is bright red. Does have hemorrhoids. Also complains of lower abd pain and lower back pain that started about 3 days ago.

## 2022-12-26 NOTE — Discharge Instructions (Signed)
Take a stool softener such as Colace which is available over-the-counter.  Follow-up with your primary care doctor as well as with the general surgeon.  A referral has been provided.  Return to the ER for new, worsening, or persistent severe bleeding, rectal or abdominal pain, weakness or lightheadedness, or any other new or worsening symptoms that concern you.

## 2023-05-24 ENCOUNTER — Encounter: Payer: Self-pay | Admitting: Emergency Medicine

## 2023-05-24 ENCOUNTER — Ambulatory Visit
Admission: EM | Admit: 2023-05-24 | Discharge: 2023-05-24 | Disposition: A | Discharge status: Home / Self Care | Payer: Medicaid Other | Attending: Family Medicine | Admitting: Family Medicine

## 2023-05-24 DIAGNOSIS — N760 Acute vaginitis: Secondary | ICD-10-CM | POA: Insufficient documentation

## 2023-05-24 DIAGNOSIS — B9689 Other specified bacterial agents as the cause of diseases classified elsewhere: Secondary | ICD-10-CM | POA: Insufficient documentation

## 2023-05-24 LAB — WET PREP, GENITAL
Sperm: NONE SEEN
Trich, Wet Prep: NONE SEEN
WBC, Wet Prep HPF POC: <10 (ref <10)
Yeast Wet Prep HPF POC: NONE SEEN

## 2023-05-24 LAB — URINALYSIS, W/ REFLEX TO CULTURE (INFECTION SUSPECTED)
Bilirubin Urine: NEGATIVE
Glucose, UA: NEGATIVE mg/dL
Hgb urine dipstick: NEGATIVE
Ketones, ur: NEGATIVE mg/dL
Leukocytes,Ua: NEGATIVE
Nitrite: NEGATIVE
Protein, ur: NEGATIVE mg/dL
Specific Gravity, Urine: 1.02 (ref 1.005–1.030)
pH: 6 (ref 5.0–8.0)

## 2023-05-24 MED ORDER — ONDANSETRON 4 MG PO TBDP: 4.0000 mg | ORAL_TABLET | Freq: Three times a day (TID) | ORAL | 0 refills | Status: AC | PRN

## 2023-05-24 MED ORDER — METRONIDAZOLE 500 MG PO TABS: 500.0000 mg | ORAL_TABLET | Freq: Two times a day (BID) | ORAL | 0 refills | Status: AC

## 2023-05-24 NOTE — ED Triage Notes (Signed)
Patient c/o dysuria, vaginal discharge, and urinary frequency and lower back pain that started about a week ago.  Patient denies fevers.

## 2023-05-24 NOTE — Discharge Instructions (Addendum)
Your urinalysis was clear and did not show any red blood cells or white blood cells.  There is no sign that you had a urinary tract infection.  Your wet mount did show clue cells which is a sign of bacterial vaginosis.  There was no yeast on the wet mount.  Take metronidazole 500 mg--1 tablet 2 times daily for 7 days.  Avoid drinking alcohol within 72 hours of taking this medication  Ondansetron dissolved in the mouth every 8 hours as needed for nausea or vomiting. Clear liquids(water, gatorade/pedialyte, ginger ale/sprite, chicken broth/soup) and bland things(crackers/toast, rice, potato, bananas) to eat. Avoid acidic foods like lemon/lime/orange/tomato, and avoid greasy/spicy foods.

## 2023-05-24 NOTE — ED Provider Notes (Signed)
[facility]    CSN: [id number] Arrival date & time: 05/24/23  1231      History   Chief Complaint Chief Complaint  Patient presents with   Back Pain   Urinary Frequency    HPI Marissa Rice is a 27 y.o. female.    Back Pain Urinary Frequency  Here for some vaginal irritation and burning after urination and some constant vaginal burning.  She is also had vaginal discharge but no itching.   She has had some low back pain and may be some urinary frequency.  There has been a constant burning in addition to an increased burning after urination.   She has had some nausea but no vomiting.  No fever or chills and no cough or congestion.  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 No pertinent surgical history.  OB History     Gravida  0   Para  0   Term  0   Preterm  0   AB  0   Living  0      SAB  0   IAB  0   Ectopic  0   Multiple  0   Live Births               Home Medications    Prior to Admission medications   Medication Sig Start Date End Date Taking? Authorizing Provider  metroNIDAZOLE (FLAGYL) 500 MG tablet Take 1 tablet (500 mg total) by mouth 2 (two) times daily for 7 days. 05/24/23 05/31/23 Yes Zenia Resides, MD  ondansetron (ZOFRAN-ODT) 4 MG disintegrating tablet Take 1 tablet (4 mg total) by mouth every 8 (eight) hours as needed for nausea or vomiting. 05/24/23  Yes Zenia Resides, MD  ALAYCEN 1/35 tablet TOME UNA TABLETA TODOS LOS D AS 05/14/21   [provider]  albuterol (VENTOLIN HFA) 108 (90 Base) MCG/ACT inhaler  Inhale 2 puffs into the lungs every 4 (four) hours as needed for wheezing or shortness of breath. 02/13/22   Irean Hong, MD  ibuprofen (ADVIL) 600 MG tablet Take 1 tablet (600 mg total) by mouth every 6 (six) hours as needed. 08/06/22   Becky Augusta, NP  spironolactone (ALDACTONE) 25 MG tablet Take 1 tablet by mouth daily. 05/14/21   [provider]  terconazole (TERAZOL 3) 0.8 % vaginal cream Place 1 applicator vaginally at bedtime. 08/29/21   Harle Battiest, PA-C    Family History History reviewed. No pertinent family history.  Social History Social History   Tobacco Use   Smoking status: Some Days    Types: Cigarettes   Smokeless tobacco: Never  Vaping Use   Vaping status: Never Used  Substance Use Topics   Alcohol use: Yes   Drug use: Not Currently    Types: Marijuana     Allergies   Amlodipine and Hydrochlorothiazide   Review of Systems Review of Systems  Genitourinary:  Positive for frequency.  Musculoskeletal:  Positive for back pain.     Physical Exam Triage Vital Signs ED Triage Vitals  Encounter Vitals Group  BP 05/24/23 1309 132/74     Systolic BP Percentile --      Diastolic BP Percentile --      Pulse Rate 05/24/23 1309 86     Resp 05/24/23 1309 15     Temp 05/24/23 1309 98.1 F (36.7 C)     Temp Source 05/24/23 1309 Oral     SpO2 05/24/23 1309 99 %     Weight 05/24/23 1305 260 lb (117.9 kg)     Height 05/24/23 1305 5\' 7"  (1.702 m)     Head Circumference --      Peak Flow --      Pain Score 05/24/23 1305 4     Pain Loc --      Pain Education --      Exclude from Growth Chart --    No data found.  Updated Vital Signs BP 132/74 (BP Location: Left Arm)   Pulse 86   Temp 98.1 F (36.7 C) (Oral)   Resp 15   Ht 5\' 7"  (1.702 m)   Wt 117.9 kg   LMP 05/02/2023 (Approximate)   SpO2 99%   BMI 40.72 kg/m   Visual Acuity Right Eye Distance:   Left Eye Distance:   Bilateral Distance:    Right Eye Near:   Left Eye Near:     Bilateral Near:     Physical Exam Vitals reviewed.  Constitutional:      General: She is not in acute distress.    Appearance: She is not toxic-appearing.  HENT:     Nose: Nose normal.     Mouth/Throat:     Mouth: Mucous membranes are moist.     Pharynx: No oropharyngeal exudate or posterior oropharyngeal erythema.  Eyes:     Extraocular Movements: Extraocular movements intact.     Conjunctiva/sclera: Conjunctivae normal.     Pupils: Pupils are equal, round, and reactive to light.  Cardiovascular:     Rate and Rhythm: Normal rate and regular rhythm.     Heart sounds: No murmur heard. Pulmonary:     Effort: Pulmonary effort is normal. No respiratory distress.     Breath sounds: No stridor. No wheezing, rhonchi or rales.  Chest:     Chest wall: No tenderness.  Abdominal:     Palpations: Abdomen is soft.     Tenderness: There is no abdominal tenderness.  Musculoskeletal:     Cervical back: Neck supple.  Lymphadenopathy:     Cervical: No cervical adenopathy.  Skin:    Capillary Refill: Capillary refill takes less than 2 seconds.     Coloration: Skin is not jaundiced or pale.  Neurological:     General: No focal deficit present.     Mental Status: She is alert and oriented to person, place, and time.  Psychiatric:        Behavior: Behavior normal.      UC Treatments / Results  Labs (all labs ordered are listed, but only abnormal results are displayed) Labs Reviewed  WET PREP, GENITAL - Abnormal; Notable for the following components:      Result Value   Clue Cells Wet Prep HPF POC PRESENT (*)    All other components within normal limits  URINALYSIS, W/ REFLEX TO CULTURE (INFECTION SUSPECTED) - Abnormal; Notable for the following components:   APPearance HAZY (*)    Bacteria, UA FEW (*)    All other components within normal limits    EKG   Radiology No results found.  Procedures Procedures (  including critical care time)  Medications Ordered in  UC Medications - No data to display  Initial Impression / Assessment and Plan / UC Course  I have reviewed the triage vital signs and the nursing notes.  Pertinent labs & imaging results that were available during my care of the patient were reviewed by me and considered in my medical decision making (see chart for details).  Clinical Course as of 05/24/23 1351  Sat May 24, 2023  1348 WBC, UA: 0-5 [PB]    Clinical Course User Index [PB] Zenia Resides, MD       There are clue cells but no yeast on the wet mount.  Urinalysis shows no nitrites or red blood cells or white blood cells.  Metronidazole is sent in for BV.  Some Zofran is sent in for the nausea. Final Clinical Impressions(s) / UC Diagnoses   Final diagnoses:  Bacterial vaginosis     Discharge Instructions      Your urinalysis was clear and did not show any red blood cells or white blood cells.  There is no sign that you had a urinary tract infection.  Your wet mount did show clue cells which is a sign of bacterial vaginosis.  There was no yeast on the wet mount.  Take metronidazole 500 mg--1 tablet 2 times daily for 7 days.  Avoid drinking alcohol within 72 hours of taking this medication  Ondansetron dissolved in the mouth every 8 hours as needed for nausea or vomiting. Clear liquids(water, gatorade/pedialyte, ginger ale/sprite, chicken broth/soup) and bland things(crackers/toast, rice, potato, bananas) to eat. Avoid acidic foods like lemon/lime/orange/tomato, and avoid greasy/spicy foods.       ED Prescriptions     Medication Sig Dispense Auth. Provider   metroNIDAZOLE (FLAGYL) 500 MG tablet Take 1 tablet (500 mg total) by mouth 2 (two) times daily for 7 days. 14 tablet Arleen Bar, Janace Aris, MD   ondansetron (ZOFRAN-ODT) 4 MG disintegrating tablet Take 1 tablet (4 mg total) by mouth every 8 (eight) hours as needed for nausea or vomiting. 10 tablet Marlinda Mike Janace Aris, MD      PDMP not reviewed this  encounter.   Zenia Resides, MD 05/24/23 1351

## 2023-10-04 ENCOUNTER — Emergency Department
Admission: EM | Admit: 2023-10-04 | Discharge: 2023-10-04 | Disposition: A | Discharge status: Home / Self Care | Attending: Emergency Medicine | Admitting: Emergency Medicine

## 2023-10-04 ENCOUNTER — Other Ambulatory Visit: Payer: Self-pay

## 2023-10-04 DIAGNOSIS — B349 Viral infection, unspecified: Secondary | ICD-10-CM | POA: Diagnosis not present

## 2023-10-04 DIAGNOSIS — I10 Essential (primary) hypertension: Secondary | ICD-10-CM | POA: Diagnosis not present

## 2023-10-04 DIAGNOSIS — M791 Myalgia, unspecified site: Secondary | ICD-10-CM | POA: Diagnosis not present

## 2023-10-04 DIAGNOSIS — R519 Headache, unspecified: Secondary | ICD-10-CM | POA: Diagnosis present

## 2023-10-04 LAB — LACTIC ACID, PLASMA: Lactic Acid, Venous: 1.8 mmol/L (ref 0.5–1.9)

## 2023-10-04 LAB — CBC
HCT: 43.5 % (ref 36.0–46.0)
Hemoglobin: 14.5 g/dL (ref 12.0–15.0)
MCH: 28.6 pg (ref 26.0–34.0)
MCHC: 33.3 g/dL (ref 30.0–36.0)
MCV: 85.8 fL (ref 80.0–100.0)
Platelets: 256 10*3/uL (ref 150–400)
RBC: 5.07 MIL/uL (ref 3.87–5.11)
RDW: 13 % (ref 11.5–15.5)
WBC: 6.6 10*3/uL (ref 4.0–10.5)
nRBC: 0 % (ref 0.0–0.2)

## 2023-10-04 LAB — URINALYSIS, ROUTINE W REFLEX MICROSCOPIC
Bacteria, UA: NONE SEEN
Bilirubin Urine: NEGATIVE
Glucose, UA: NEGATIVE mg/dL
Hgb urine dipstick: NEGATIVE
Ketones, ur: NEGATIVE mg/dL
Nitrite: NEGATIVE
Protein, ur: NEGATIVE mg/dL
Specific Gravity, Urine: 1.016 (ref 1.005–1.030)
pH: 7 (ref 5.0–8.0)

## 2023-10-04 LAB — BASIC METABOLIC PANEL
Anion gap: 8 (ref 5–15)
BUN: 11 mg/dL (ref 6–20)
CO2: 25 mmol/L (ref 22–32)
Calcium: 8.9 mg/dL (ref 8.9–10.3)
Chloride: 104 mmol/L (ref 98–111)
Creatinine, Ser: 0.77 mg/dL (ref 0.44–1.00)
GFR, Estimated: >60 mL/min (ref >60)
Glucose, Bld: 134 mg/dL — ABNORMAL HIGH (ref 70–99)
Potassium: 3.8 mmol/L (ref 3.5–5.1)
Sodium: 137 mmol/L (ref 135–145)

## 2023-10-04 LAB — POC URINE PREG, ED: Preg Test, Ur: NEGATIVE

## 2023-10-04 NOTE — ED Triage Notes (Addendum)
 Pt c/o dental pain, headache, lower back pain, feverish feeling, and states she gets frequent UTI's. PT is AOX4, NAD noted. Pt took tylenol this morning. Pt denies pain w/ urination.

## 2023-10-04 NOTE — Discharge Instructions (Signed)
 Take acetaminophen 650 mg and ibuprofen 400 mg every 6 hours for pain.  Take with food. Drink plenty of fluids to stay well-hydrated.  Find Pedialyte or similar electrolyte rehydration formulas at your local pharmacy. Thank you for choosing [place] for your health care today!  Please see your primary doctor this week for a follow up appointment.   If you have any new, worsening, or unexpected symptoms call your doctor right away or come back to the emergency department for reevaluation.  It was my pleasure to care for you today.   Daneil Dan Modesto Charon, MD

## 2023-10-04 NOTE — ED Provider Notes (Signed)
 [facility] Provider Note    Event Date/Time   First MD Initiated Contact with Patient 10/04/23 2258     (approximate)   History   Back Pain   HPI  Chianna Spirito is a 28 y.o. female   Past medical history of hypertension elevated BMI and thyroid disease who presents to the emergency department with 3 days of diffuse myalgias throughout the entire body including shoulders, back, arms, neck.  She feels like her lymph nodes are swollen underneath her neck bilaterally and she has frontal facial pressure.  She denies any cough, sore throat, fevers or chills.  She has gingival pain.  She has no dysuria or frequency.  She denies abdominal pain nausea vomiting or diarrhea.  She wonders if she has UTI because she has had them in the past.  Independent Historian contributed to assessment above: Mother is at bedside to corroborate information past medical history as above       Physical Exam   Triage Vital Signs: ED Triage Vitals  Encounter Vitals Group     BP 10/04/23 2216 (!) 147/104     Systolic BP Percentile --      Diastolic BP Percentile --      Pulse Rate 10/04/23 2216 (!) 103     Resp 10/04/23 2216 16     Temp 10/04/23 2216 99.2 F (37.3 C)     Temp Source 10/04/23 2216 Oral     SpO2 10/04/23 2216 97 %     Weight --      Height --      Head Circumference --      Peak Flow --      Pain Score 10/04/23 2217 7     Pain Loc --      Pain Education --      Exclude from Growth Chart --     Most recent vital signs: Vitals:   10/04/23 2216 10/04/23 2315  BP: (!) 147/104 (!) 142/94  Pulse: (!) 103 95  Resp: 16 16  Temp: 99.2 F (37.3 C) 99 F (37.2 C)  SpO2: 97% 98%    General: Awake, no distress.  CV:  Good peripheral perfusion. Resp:  Normal effort.  Abd:  No distention.  Other:  Well-appearing patient in no acute distress sitting on stretcher comfortably.  She has a benign abdominal exam, soft and nontender to palpation all  quadrants.  Her lungs are clear to auscultation without focality or wheezing her neck is supple with full range of motion.  She has no discrete masses underneath the neck or tenderness to palpation.  Her posterior oropharynx appears normal without any masses exudates and her uvula is midline.  No obvious dental caries or gingival disease.  She is in no respiratory distress and no signs of airway obstruction.   ED Results / Procedures / Treatments   Labs (all labs ordered are listed, but only abnormal results are displayed) Labs Reviewed  URINALYSIS, ROUTINE W REFLEX MICROSCOPIC - Abnormal; Notable for the following components:      Result Value   Color, Urine YELLOW (*)    APPearance HAZY (*)    Leukocytes,Ua TRACE (*)    All other components within normal limits  BASIC METABOLIC PANEL - Abnormal; Notable for the following components:   Glucose, Bld 134 (*)    All other components within normal limits  CBC  LACTIC ACID, PLASMA  POC URINE PREG, ED     I ordered and reviewed the  above labs they are notable for cell counts electrolytes unremarkable. (-) Pregnancy test.   PROCEDURES:  Critical Care performed: No  Procedures   MEDICATIONS ORDERED IN ED: Medications - No data to display  IMPRESSION / MDM / ASSESSMENT AND PLAN / ED COURSE  I reviewed the triage vital signs and the nursing notes.                                Patient's presentation is most consistent with acute presentation with potential threat to life or bodily function.  Differential diagnosis includes, but is not limited to, viral illness, cervical lymphadenopathy, gingival disease, considered but less likely meningitis, bacterial infection, airway obstruction like Ludwick's, deep space neck infection or abscess   The patient is on the cardiac monitor to evaluate for evidence of arrhythmia and/or significant heart rate changes.  MDM:    Nondescript symptoms most likely viral syndrome with myalgias,  some sinus pressure.  No localizing infectious symptoms per patient report nor on exam.  She appears well and nontoxic without any signs of airway obstruction or threatening bacterial infections like meningitis, bacterial pneumonia, intraoral abscesses, Ludwig's angina.  I am not sure what is causing her symptoms but I do not think there are life-threatening condition at this time, I advised for analgesia and follow-up with primary doctor and certainly can return to the emergency department for any new or worsening symptoms.  Discharge.         FINAL CLINICAL IMPRESSION(S) / ED DIAGNOSES   Final diagnoses:  Viral syndrome  Myalgia     Rx / DC Orders   ED Discharge Orders     None        Note:  This document was prepared using Dragon voice recognition software and may include unintentional dictation errors.    Pilar Jarvis, MD 10/05/23 [phone]

## 2023-10-06 ENCOUNTER — Ambulatory Visit
Admission: EM | Admit: 2023-10-06 | Discharge: 2023-10-06 | Disposition: A | Discharge status: Home / Self Care | Attending: Physician Assistant | Admitting: Physician Assistant

## 2023-10-06 DIAGNOSIS — R5383 Other fatigue: Secondary | ICD-10-CM | POA: Insufficient documentation

## 2023-10-06 DIAGNOSIS — I10 Essential (primary) hypertension: Secondary | ICD-10-CM | POA: Insufficient documentation

## 2023-10-06 DIAGNOSIS — M791 Myalgia, unspecified site: Secondary | ICD-10-CM | POA: Insufficient documentation

## 2023-10-06 DIAGNOSIS — J029 Acute pharyngitis, unspecified: Secondary | ICD-10-CM | POA: Insufficient documentation

## 2023-10-06 DIAGNOSIS — R591 Generalized enlarged lymph nodes: Secondary | ICD-10-CM | POA: Diagnosis not present

## 2023-10-06 LAB — GROUP A STREP BY PCR: Group A Strep by PCR: NOT DETECTED

## 2023-10-06 MED ORDER — LIDOCAINE VISCOUS HCL 2 % MT SOLN: 15.0000 mL | OROMUCOSAL | 0 refills | Status: AC | PRN

## 2023-10-06 NOTE — ED Provider Notes (Signed)
 [facility]    CSN: [id number] Arrival date & time: 10/06/23  1430      History   Chief Complaint Chief Complaint  Patient presents with   Sore Throat   Fever   Headache   Generalized Body Aches    HPI Marissa Rice is a 28 y.o. female presenting for evaluation of 5-day history of illness.  Patient reports fatigue, feeling feverish at night, body aches, headaches, swollen glands of neck and sore throat.  Denies recorded fever, cough, congestion, difficulty swallowing, shortness of breath, chest pain.  Patient was seen by the dentist today to have a dental cleaning.  She advised to be evaluated for her sore throat.  Patient was seen in the ED 2 days ago.  At that time she was seen for fatigue, body aches, headaches, lymphadenopathy of neck and sinus pressure.  Patient had BMP, lactic acid, CBC, urinalysis, urine pregnancy test.  No significant findings.  Diagnosed with a viral URI.  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 No pertinent surgical history.  OB History     Gravida  0   Para  0   Term  0   Preterm  0   AB  0   Living  0      SAB  0   IAB  0   Ectopic  0   Multiple  0   Live Births               Home Medications    Prior to Admission medications   Medication Sig Start Date End Date Taking? Authorizing Provider  lidocaine (XYLOCAINE) 2 % solution Use as directed 15 mLs in the mouth or throat every 3 (three) hours as needed for mouth pain (swish and spit). 10/06/23  Yes Shirlee Latch, PA-C  ALAYCEN 1/35 tablet TOME UNA TABLETA TODOS LOS D AS 05/14/21   [provider]   albuterol (VENTOLIN HFA) 108 (90 Base) MCG/ACT inhaler Inhale 2 puffs into the lungs every 4 (four) hours as needed for wheezing or shortness of breath. 02/13/22   Irean Hong, MD  ibuprofen (ADVIL) 600 MG tablet Take 1 tablet (600 mg total) by mouth every 6 (six) hours as needed. 08/06/22   Becky Augusta, NP  ondansetron (ZOFRAN-ODT) 4 MG disintegrating tablet Take 1 tablet (4 mg total) by mouth every 8 (eight) hours as needed for nausea or vomiting. 05/24/23   Zenia Resides, MD  spironolactone (ALDACTONE) 25 MG tablet Take 1 tablet by mouth daily. 05/14/21   [provider]  terconazole (TERAZOL 3) 0.8 % vaginal cream Place 1 applicator vaginally at bedtime. 08/29/21   Harle Battiest, PA-C    Family History History reviewed. No pertinent family history.  Social History Social History   Tobacco Use   Smoking status: Some Days    Types: Cigarettes   Smokeless tobacco: Never  Vaping Use   Vaping status: Never Used  Substance Use Topics   Alcohol use: Yes   Drug use: Not Currently    Types: Marijuana     Allergies   Amlodipine and Hydrochlorothiazide   Review  of Systems Review of Systems  Constitutional:  Positive for diaphoresis and fatigue. Negative for chills and fever.  HENT:  Positive for sore throat. Negative for congestion, ear pain, rhinorrhea and sinus pain.   Respiratory:  Negative for cough and shortness of breath.   Cardiovascular:  Negative for chest pain.  Gastrointestinal:  Negative for abdominal pain, nausea and vomiting.  Musculoskeletal:  Positive for myalgias.  Skin:  Negative for rash.  Neurological:  Positive for headaches. Negative for weakness.  Hematological:  Positive for adenopathy.     Physical Exam Triage Vital Signs ED Triage Vitals  Encounter Vitals Group     BP      Systolic BP Percentile      Diastolic BP Percentile      Pulse      Resp      Temp      Temp src      SpO2      Weight      Height      Head  Circumference      Peak Flow      Pain Score      Pain Loc      Pain Education      Exclude from Growth Chart    No data found.  Updated Vital Signs BP (!) 152/101 (BP Location: Right Arm)   Pulse 99   Temp 98.5 F (36.9 C) (Oral)   Resp 16   LMP 06/29/2023 (Exact Date)   SpO2 96%      Physical Exam Vitals and nursing note reviewed.  Constitutional:      General: She is not in acute distress.    Appearance: Normal appearance. She is well-developed. She is obese. She is not ill-appearing or toxic-appearing.  HENT:     Head: Normocephalic and atraumatic.     Nose: Nose normal.     Mouth/Throat:     Mouth: Mucous membranes are moist.     Pharynx: Oropharynx is clear. Posterior oropharyngeal erythema present.     Comments: No tenderness of salivary ducts sublingual Eyes:     General: No scleral icterus.       Right eye: No discharge.        Left eye: No discharge.     Conjunctiva/sclera: Conjunctivae normal.  Cardiovascular:     Rate and Rhythm: Normal rate and regular rhythm.     Heart sounds: Normal heart sounds.  Pulmonary:     Effort: Pulmonary effort is normal. No respiratory distress.     Breath sounds: Normal breath sounds.  Musculoskeletal:     Cervical back: Neck supple.  Lymphadenopathy:     Cervical: Cervical adenopathy (anterior cervical and submandibular mild lymphadenopathy) present.  Skin:    General: Skin is dry.  Neurological:     General: No focal deficit present.     Mental Status: She is alert. Mental status is at baseline.     Motor: No weakness.     Gait: Gait normal.  Psychiatric:        Mood and Affect: Mood normal.        Behavior: Behavior normal.      UC Treatments / Results  Labs (all labs ordered are listed, but only abnormal results are displayed) Labs Reviewed  GROUP A STREP BY PCR  0 Result Notes         Component Ref Range & Units (hover) 2 d ago (10/04/23) 9 mo ago (12/26/22)      Sodium 137  136      Potassium 3.8 4.1       Chloride 104 106      CO2 25 24      Glucose, Bld 134 High  130 High  CM        BUN 11 12      Creatinine, Ser 0.77 0.72      Calcium 8.9 9.0      GFR, Estimated >60 >60 CM        Anion gap 8 6 CM        Resulting Agency [facility]            Specimen Collected: 10/04/23 22:17 Last Resulted: 10/04/23 22:49     CBC Order: [id number]  Status: Final result     Next appt: None   Test Result Released: No (inaccessible in MyChart)   0 Result Notes         Component Ref Range & Units (hover) 2 d ago (10/04/23) 9 mo ago (12/26/22)      WBC 6.6 8.0      RBC 5.07 4.96      Hemoglobin 14.5 14.0      HCT 43.5 41.6      MCV 85.8 83.9      MCH 28.6 28.2      MCHC 33.3 33.7      RDW 13.0 13.1      Platelets 256 272      nRBC 0.0 0.0 CM                                                                                                                      Specimen Collected: 10/04/23 22:17 Last Resulted: 10/04/23 22:39       EKG   Radiology No results found.  Procedures Procedures (including critical care time)  Medications Ordered in UC Medications - No data to display  Initial Impression / Assessment and Plan / UC Course  I have reviewed the triage vital signs and the nursing notes.  Pertinent labs & imaging results that were available during my care of the patient were reviewed by me and considered in my medical decision making (see chart for details).   28 year old female presents for 2-day history of sore throat ten 5-day history of fatigue, sweats, body aches, headaches.  Seen in the ED 2 days ago and had reassuring lab work.  She was not tested for COVID, flu or strep.  Patient is afebrile.  Blood pressure elevated at 152/101.  Patient is on spironolactone.  Advised to continue spironolactone and keep a log of blood pressure.  If consistently greater than 140/90 follow-up with PCP regarding possible medication changes.  Has mild  erythema posterior pharynx and swollen cervical lymphadenopathy.  No parotid gland swelling, dental abscesses, salivary duct swelling or tenderness.  Chest clear.  Heart regular rate and rhythm.  Chest clear.  Strep negative.   Reviewed results with patient.  Symptoms consistent with viral illness.  Supportive care is advised.  Sent viscous lidocaine to pharmacy.  Thoroughly reviewed return and ED precautions.   Final Clinical Impressions(s) / UC Diagnoses   Final diagnoses:  Viral pharyngitis  Myalgia  Lymphadenopathy of head and neck  Essential hypertension  Other fatigue     Discharge Instructions       -Strep negative.  Symptoms consistent with viral illness.  Supportive care is encouraged with increasing rest and fluids, Tylenol and Motrin as needed for discomfort, throat lozenges, Chloraseptic spray.  Usually gets better within a week or 2. -Go to ED if uncontrollable fever, weakness, difficulty swallowing, chest pain, facial swelling. -Blood pressure elevated at 152/101.  Continue spironolactone and keep a log of blood pressure.  If consistently greater than 140/90 follow-up with PCP regarding possible medication changes.     ED Prescriptions     Medication Sig Dispense Auth. Provider   lidocaine (XYLOCAINE) 2 % solution Use as directed 15 mLs in the mouth or throat every 3 (three) hours as needed for mouth pain (swish and spit). 100 mL Shirlee Latch, PA-C      PDMP not reviewed this encounter.   Shirlee Latch, PA-C 10/06/23 [phone]

## 2023-10-06 NOTE — Discharge Instructions (Addendum)
-  Strep negative.  Symptoms consistent with viral illness.  Supportive care is encouraged with increasing rest and fluids, Tylenol and Motrin as needed for discomfort, throat lozenges, Chloraseptic spray.  Usually gets better within a week or 2. -Go to ED if uncontrollable fever, weakness, difficulty swallowing, chest pain, facial swelling. -Blood pressure elevated at 152/101.  Continue spironolactone and keep a log of blood pressure.  If consistently greater than 140/90 follow-up with PCP regarding possible medication changes.

## 2023-10-06 NOTE — ED Triage Notes (Signed)
 Sx x 6 days  Sore throat Fever at night Bodyaches Swollen glands headache

## 2023-12-11 IMAGING — CT CT RENAL STONE PROTOCOL
2 of 4 series · 16 of 46 positions shown, 18 images · non-contrast
Comparison: Chest XR, 07/08/2021.  US Abdomen, 08/28/2020.

CLINICAL DATA: Flank pain.  Kidney stone suspected.

EXAM:
CT ABDOMEN AND PELVIS WITHOUT CONTRAST
TECHNIQUE: Multidetector CT imaging of the abdomen and pelvis was performed
following the standard protocol without IV contrast.

[Series 2: stone full standard · axial · 0.82mm/px · z∈[-939,-434]mm · 13 of 111 slices shown, 15 images]
[im 5/111  soft-tissue]
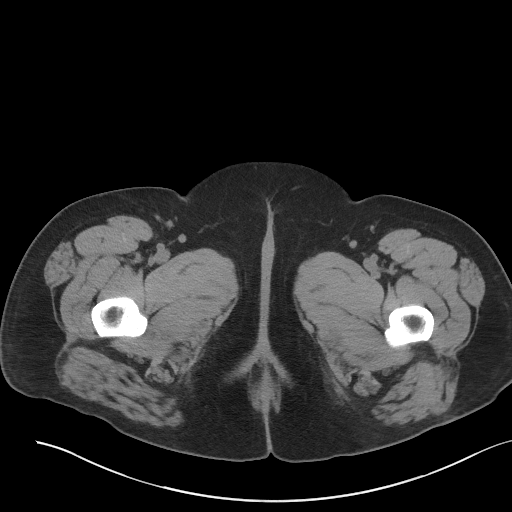
[im 5/111  bone]
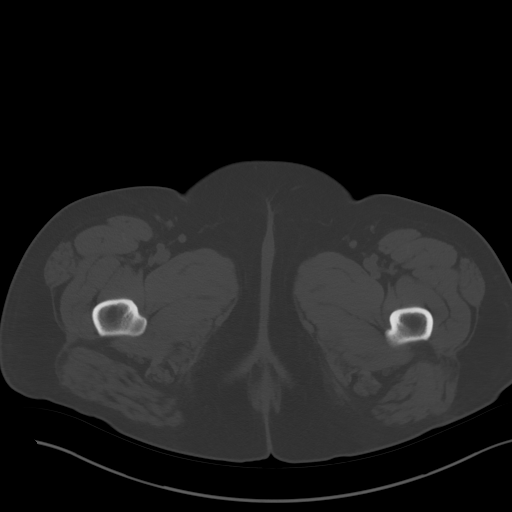
[im 15/111  soft-tissue]
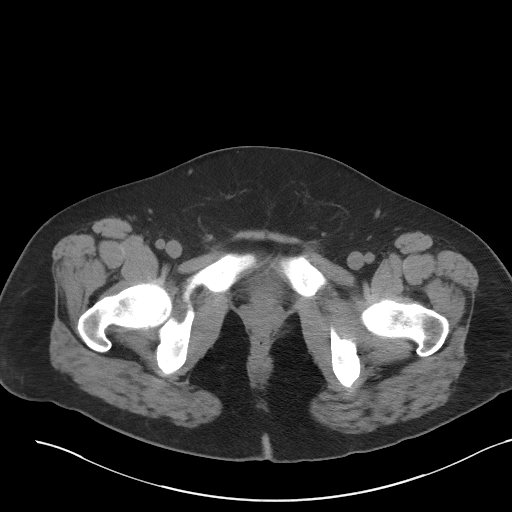
[im 24/111  soft-tissue]
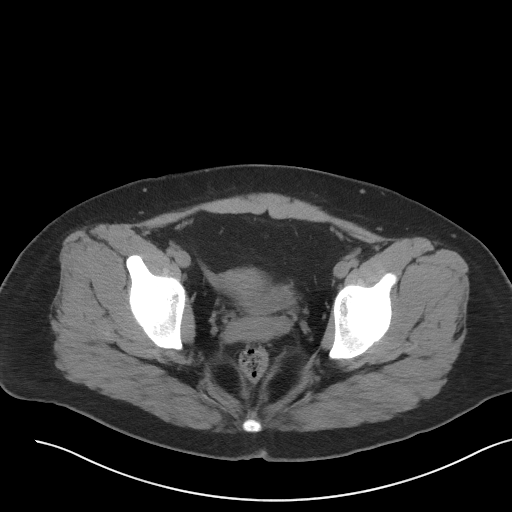
[im 29/111  soft-tissue]
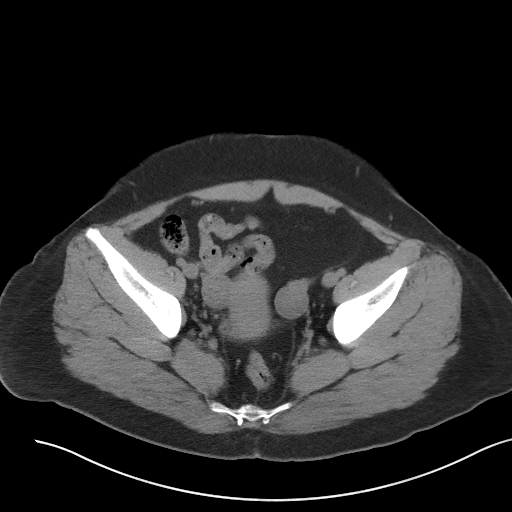
[im 39/111  soft-tissue]
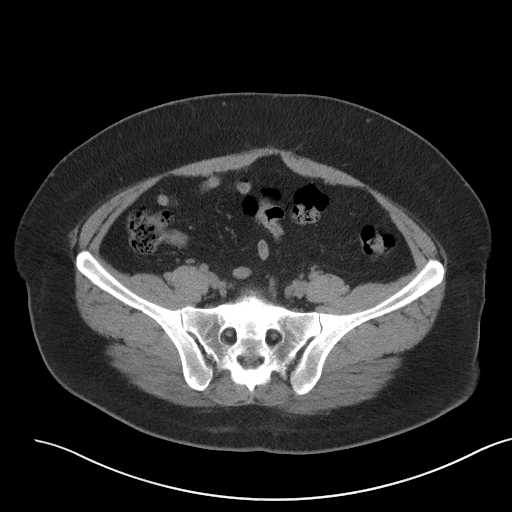
[im 48/111  soft-tissue]
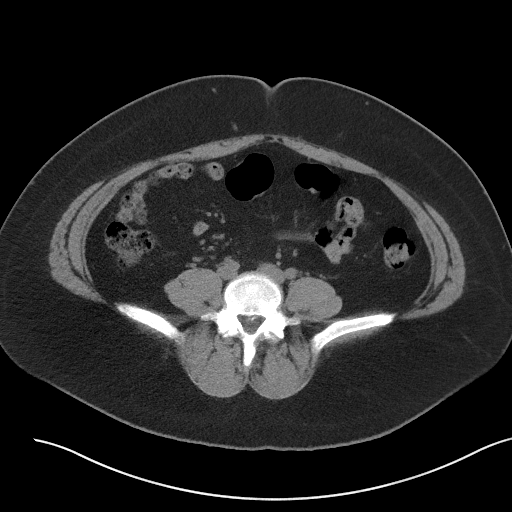
[im 58/111  soft-tissue]
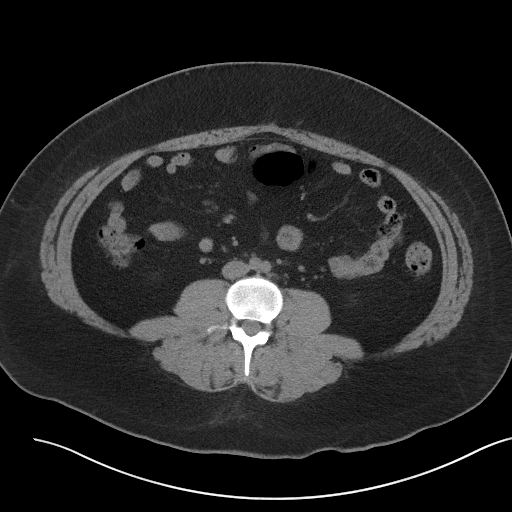
[im 63/111  soft-tissue]
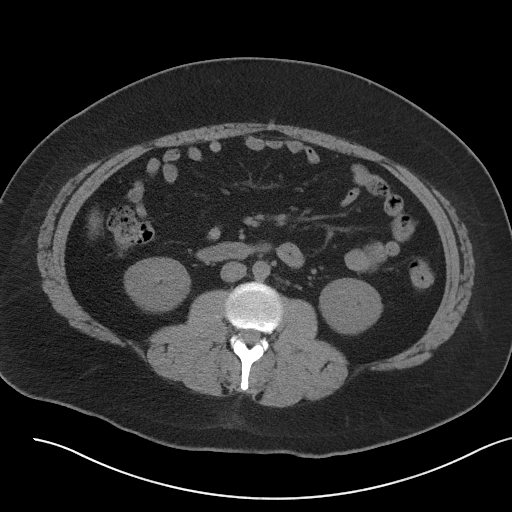
[im 72/111  soft-tissue]
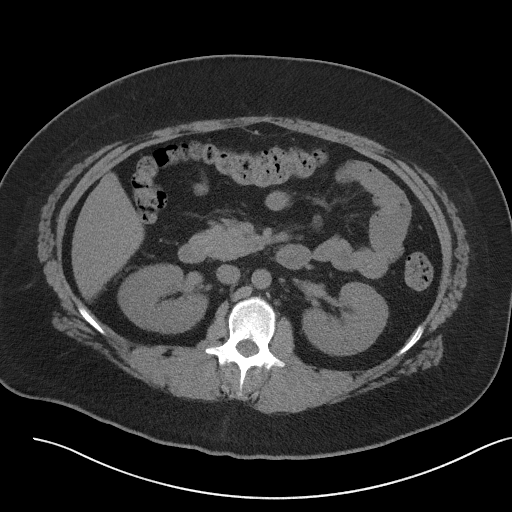
[im 72/111  bone]
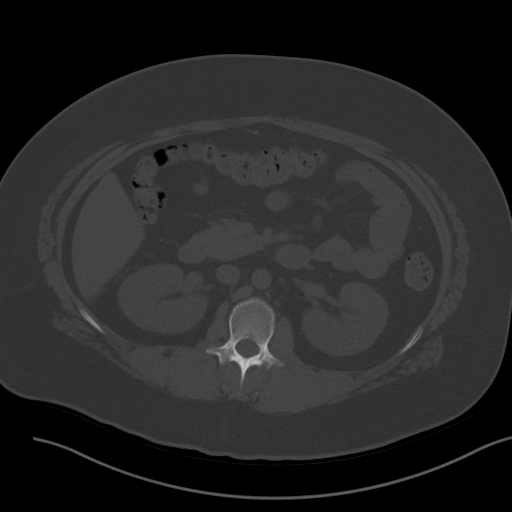
[im 82/111  soft-tissue]
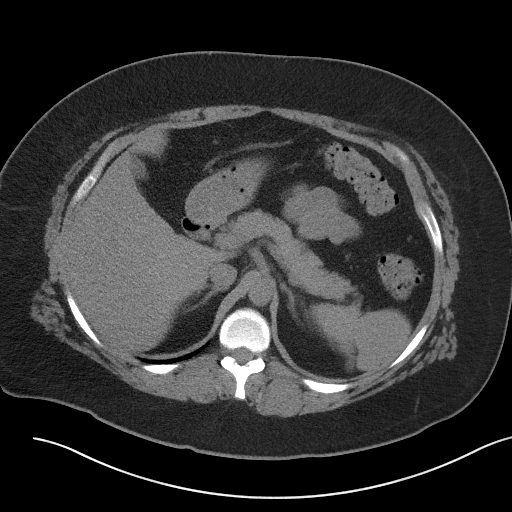
[im 87/111  soft-tissue]
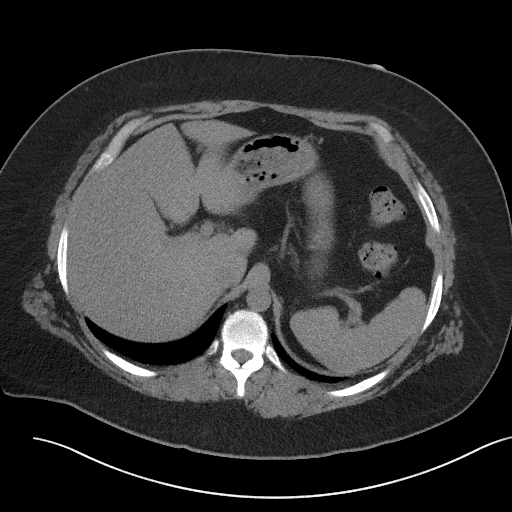
[im 96/111  soft-tissue]
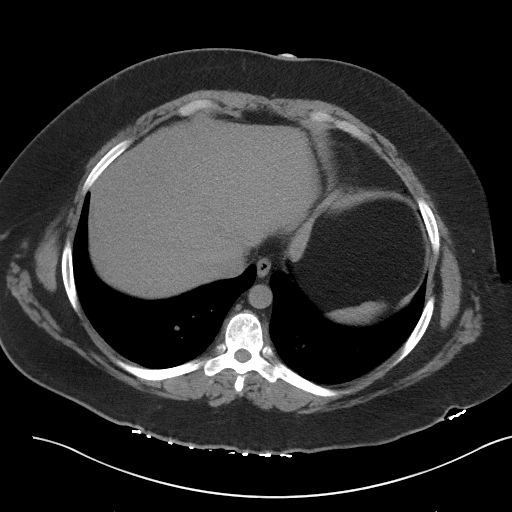
[im 106/111  soft-tissue]
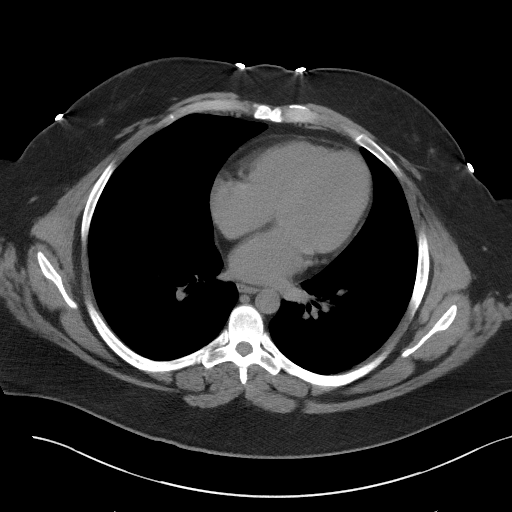

[Series 5: coronal · coronal · 0.82mm/px · 3 of 161 slices shown]
[im 54/161  soft-tissue]
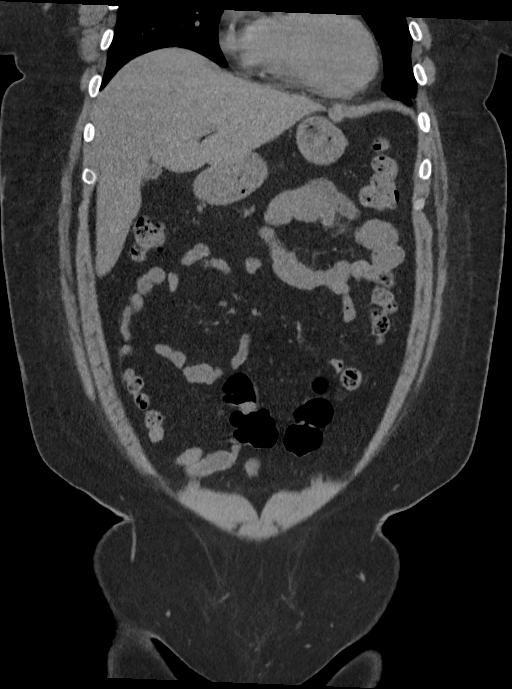
[im 72/161  soft-tissue]
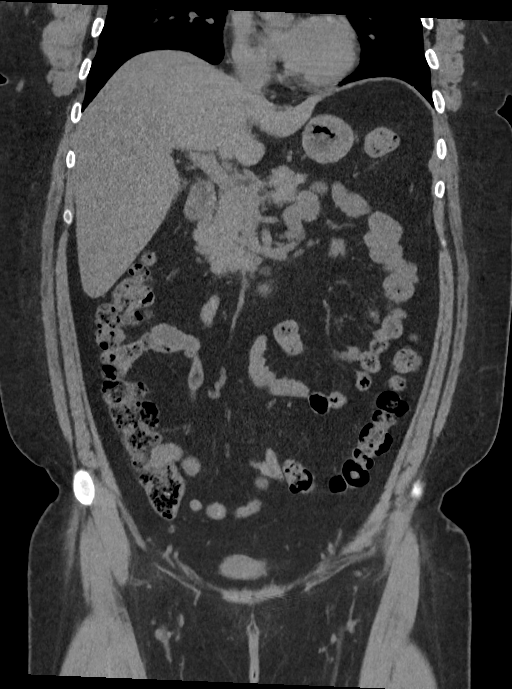
[im 89/161  soft-tissue]
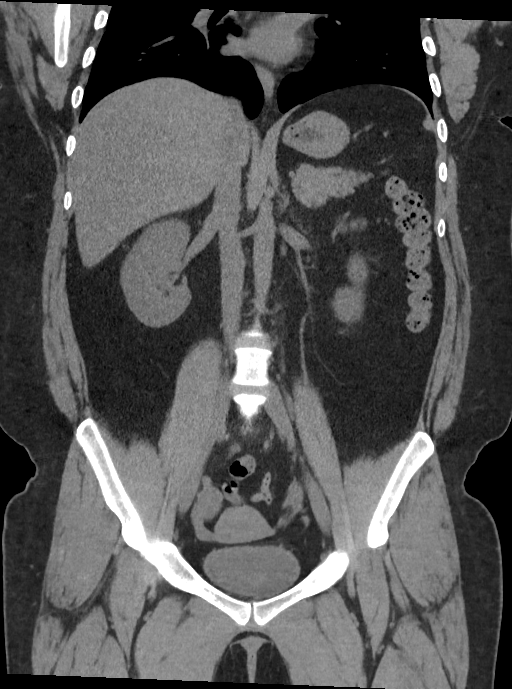

[16 of 46 positions shown; findings below may reference images not displayed]

FINDINGS: Lower chest: No acute abnormality.

Hepatobiliary: Diffuse hepatic parenchymal hypodensity. No focal
liver abnormality is seen. Contracted gallbladder. No gallstones,
gallbladder wall thickening, or biliary dilatation.

Pancreas: Unremarkable. No pancreatic ductal dilatation or
surrounding inflammatory changes.

Spleen: Normal in size without focal abnormality.

Adrenals/Urinary Tract: Adrenal glands are unremarkable. Kidneys are
normal, without renal calculi, focal lesion, or hydronephrosis.
Bladder is unremarkable.

Stomach/Bowel: Stomach is within normal limits. Appendix appears
normal. See key image. Nonobstructed small bowel. Nondilated colon.
Redundant sigmoid. No evidence of bowel wall thickening, distention,
or inflammatory changes.

Vascular/Lymphatic: No significant vascular findings are present. No
enlarged abdominal or pelvic lymph nodes.

Reproductive: Prominent bilateral ovarian follicles. See key image.
Uterus is normal.

Other: No abdominal wall hernia or abnormality. No abdominopelvic
ascites.

Musculoskeletal: L4-5 posterior disc bulge with peripheral
calcification. No acute osseous findings.
IMPRESSION: 1. No acute abdominopelvic findings
2. Hepatic steatosis.

## 2023-12-11 IMAGING — CR DG CHEST 2V
2 series · 2 of 2 positions shown · non-contrast
Comparison: 04/04/2009

CLINICAL DATA: Shortness of breath, weakness

EXAM:
CHEST - 2 VIEW

[chest pa]
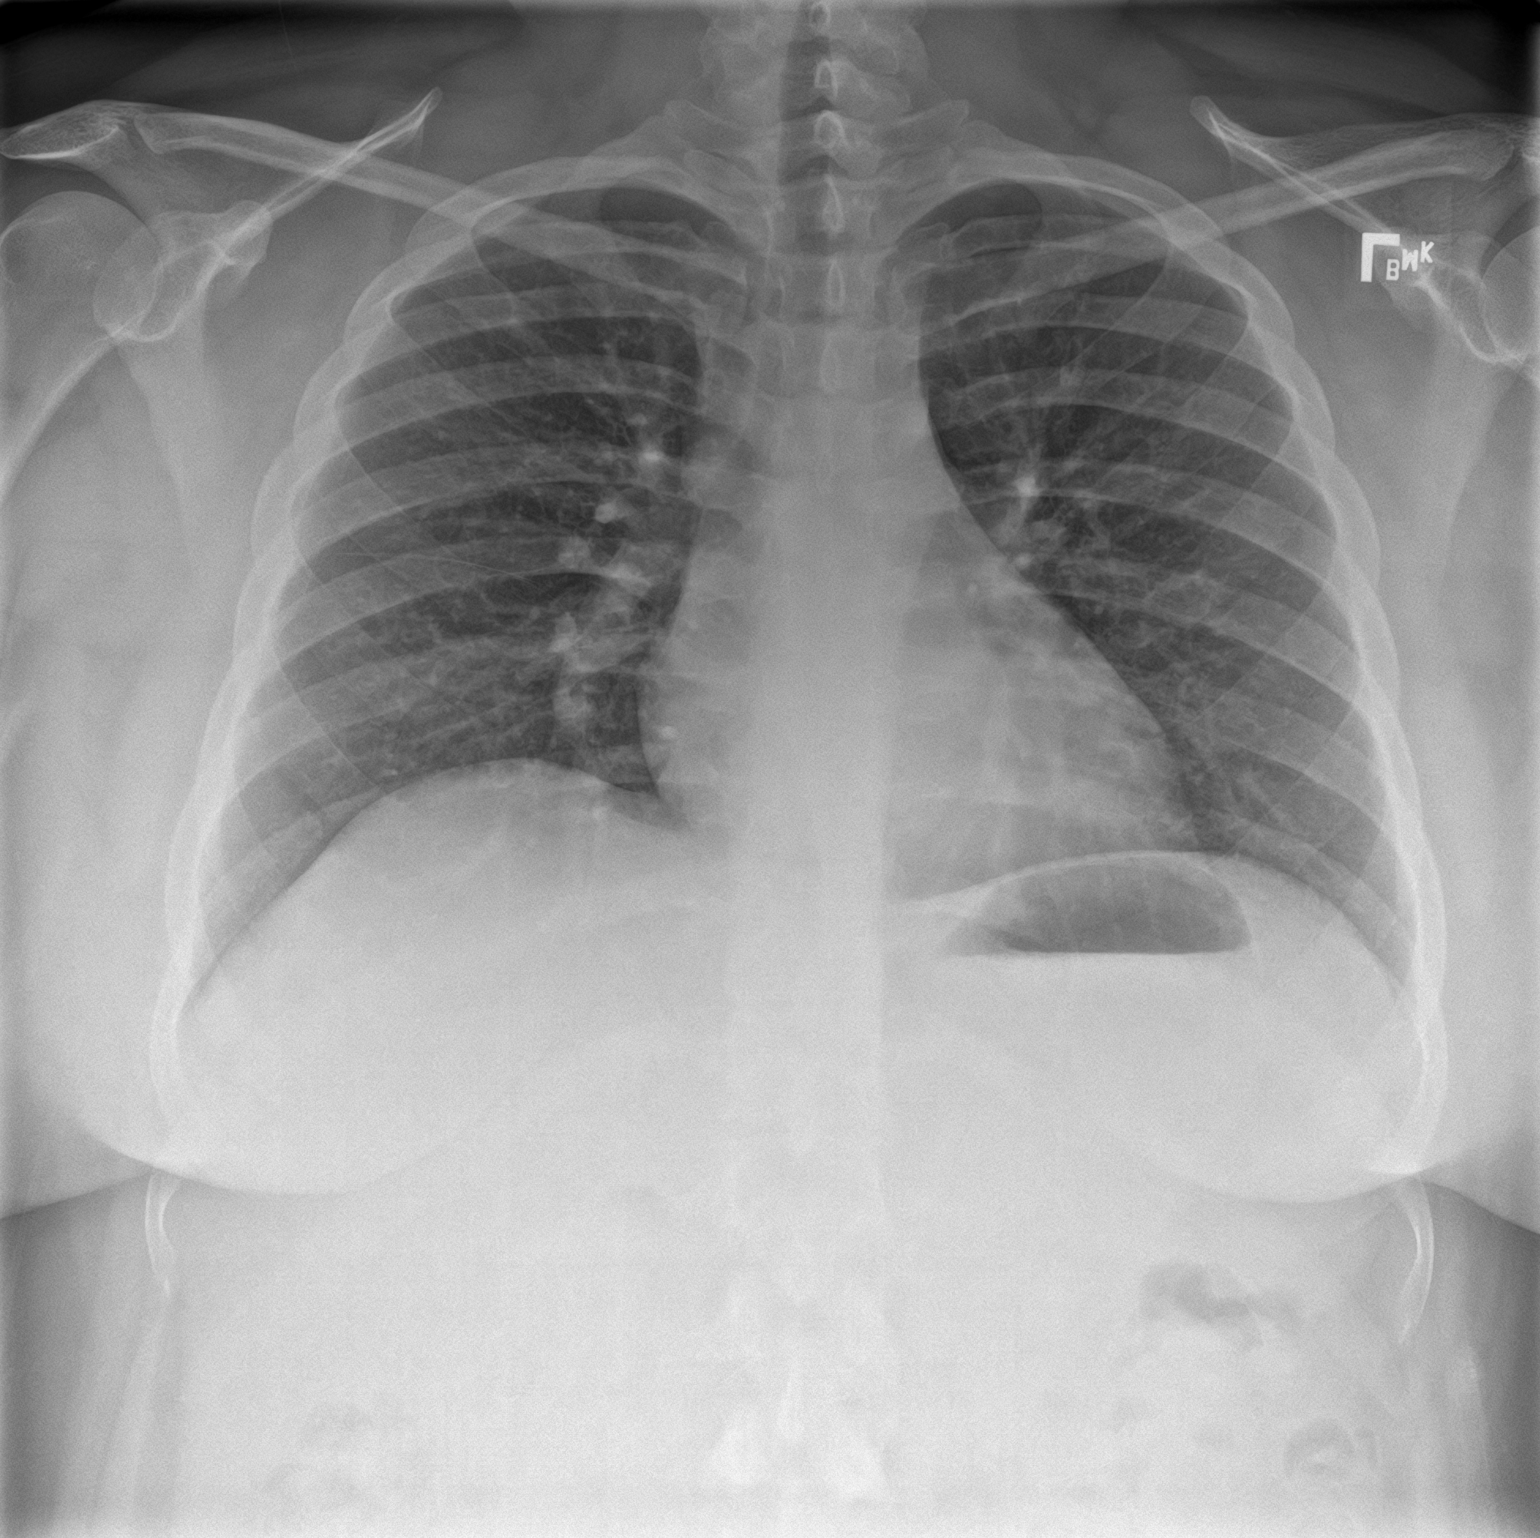

[chest lat]
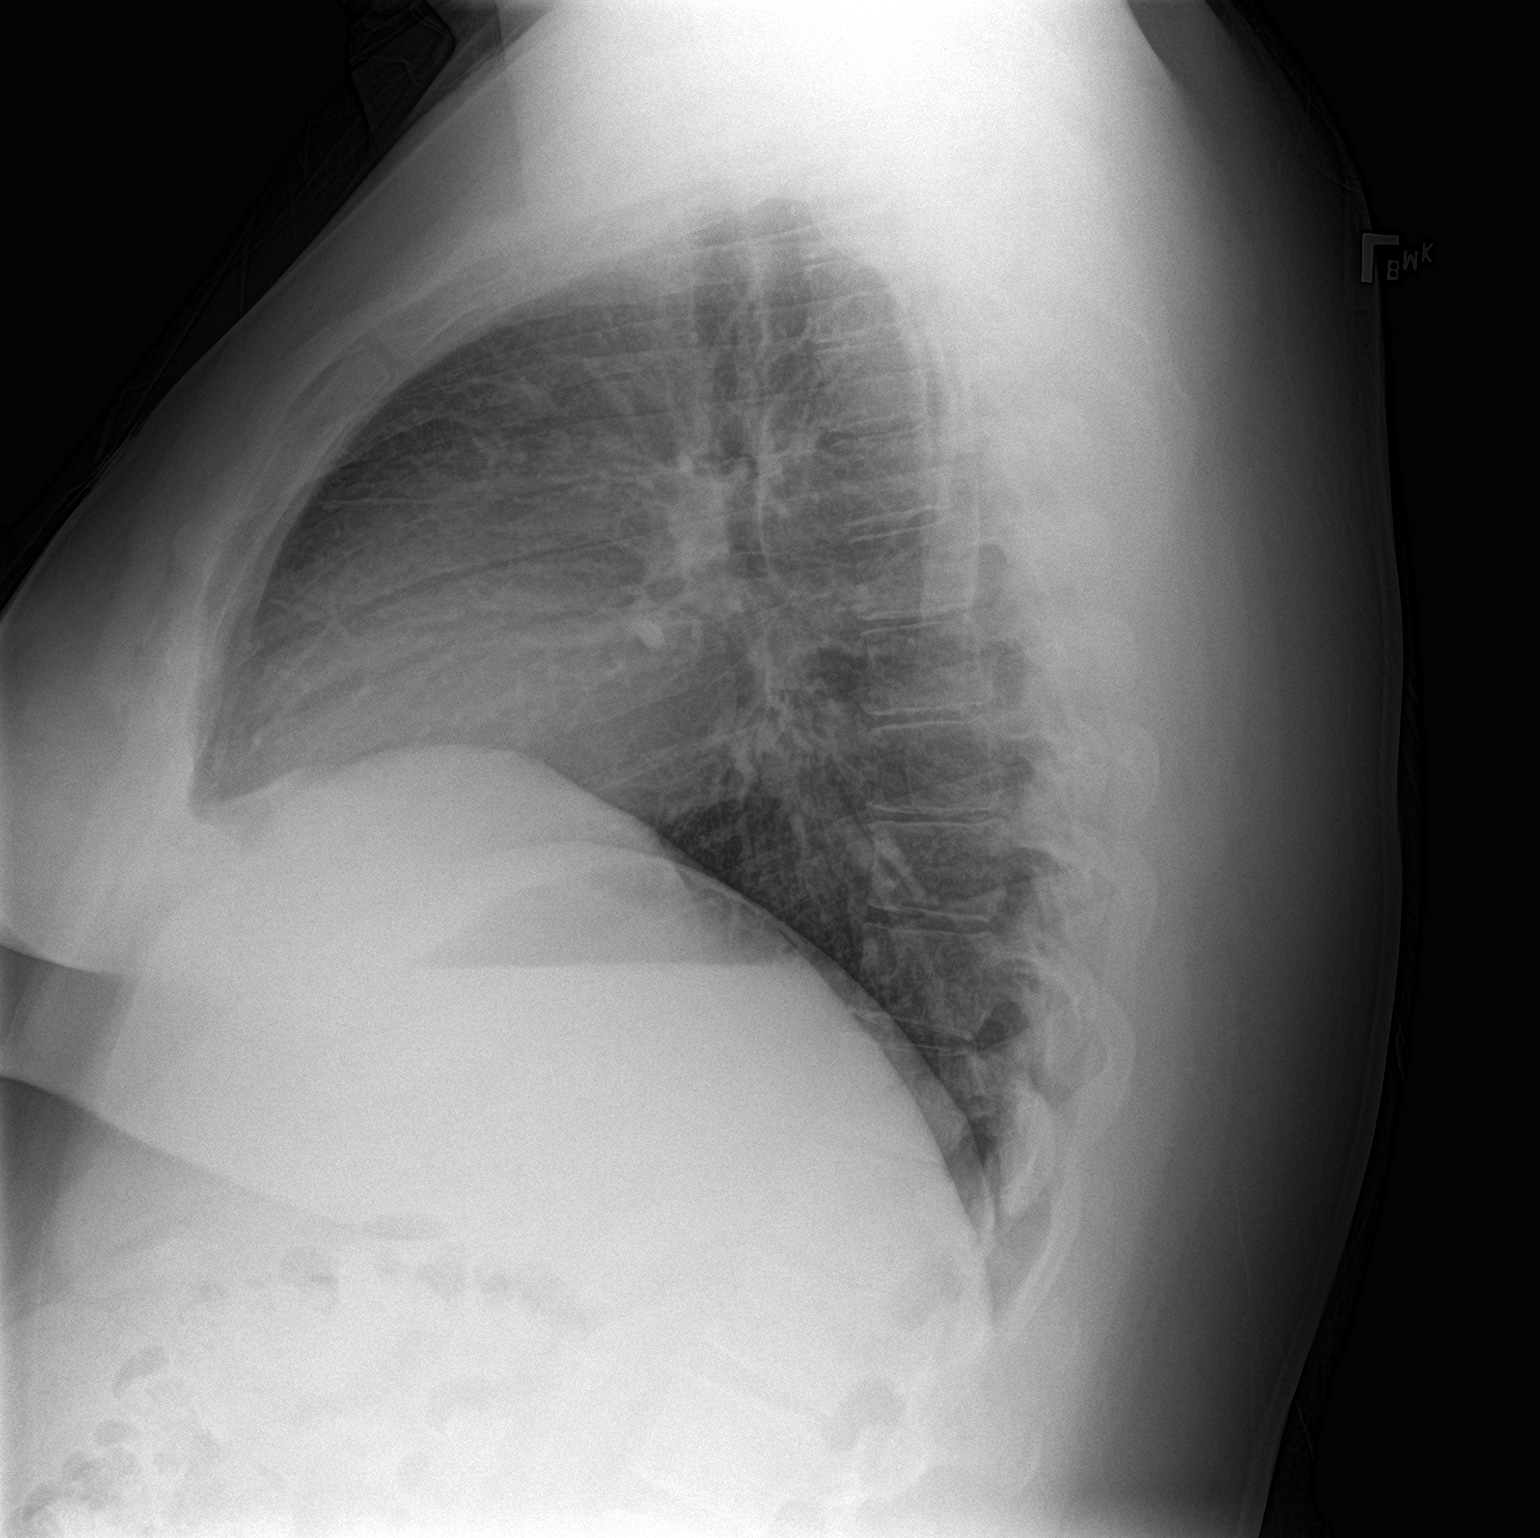

[2 of 2 positions shown; findings below may reference images not displayed]

FINDINGS: Lungs are clear.  No pleural effusion or pneumothorax.

The heart is normal in size.

Visualized osseous structures are within normal limits.
IMPRESSION: Normal chest radiographs.

## 2024-02-06 ENCOUNTER — Ambulatory Visit
Admission: EM | Admit: 2024-02-06 | Discharge: 2024-02-06 | Disposition: A | Discharge status: Home / Self Care | Attending: Emergency Medicine | Admitting: Emergency Medicine

## 2024-02-06 DIAGNOSIS — H60332 Swimmer's ear, left ear: Secondary | ICD-10-CM

## 2024-02-06 MED ORDER — NEOMYCIN-POLYMYXIN-HC 3.5-10000-1 OT SUSP: 4.0000 [drp] | Freq: Three times a day (TID) | OTIC | 0 refills | Status: AC

## 2024-02-06 NOTE — Discharge Instructions (Signed)
 Instill 4 drops of Cortisporin otic in your left ear 3 times a day for the next 7 days for treatment of your external ear infection.  Use over-the-counter Tylenol  and ibuprofen  as needed for pain or fever.  Place a hot water bottle, or heating pad, underneath your pillowcase at night to help dilate up your ear and aid in pain relief as well as resolution of the infection.  Return for reevaluation for any new or worsening symptoms.

## 2024-02-06 NOTE — ED Provider Notes (Signed)
 [facility]    CSN: [id number] Arrival date & time: 02/06/24  1545      History   Chief Complaint Chief Complaint  Patient presents with   Otalgia         HPI Rajanee Schuelke is a 28 y.o. female.   HPI  28 year old female with past medical significant for hypertension, hyperinsulinemia, hyperlipidemia, fatty liver, and thyroid disease presents for evaluation of left ear pain.  She reports that the pain has been going on for last 2 days and that it has been associated with feeling feverish but not a measured fever, sweats, and decreased hearing.  She denies any drainage from the ear.  She did recently go swimming.  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 No pertinent surgical history.  OB History     Gravida  0   Para  0   Term  0   Preterm  0   AB  0   Living  0      SAB  0   IAB  0   Ectopic  0   Multiple  0   Live Births               Home Medications    Prior to Admission medications   Medication Sig Start Date End Date Taking? Authorizing Provider  ALAYCEN 1/35 tablet TOME UNA TABLETA TODOS LOS D AS 05/14/21  Yes [provider]  neomycin -polymyxin-hydrocortisone (CORTISPORIN) 3.5-10000-1 OTIC suspension Place 4 drops into the left ear 3 (three) times daily. 02/06/24  Yes Bernardino Ditch, NP  albuterol  (VENTOLIN  HFA) 108 (90 Base) MCG/ACT inhaler Inhale 2 puffs into the lungs every 4 (four) hours as needed for wheezing or shortness of breath. 02/13/22   Sung, Jade J, MD  ibuprofen  (ADVIL ) 600 MG tablet Take 1 tablet (600 mg total) by mouth every 6 (six) hours as needed. 08/06/22   Bernardino Ditch, NP  lidocaine  (XYLOCAINE ) 2 % solution Use as directed 15 mLs in the mouth or throat every 3 (three) hours as needed for mouth pain (swish and spit). 10/06/23   Arvis Jolan NOVAK, PA-C  ondansetron  (ZOFRAN -ODT) 4 MG disintegrating tablet Take 1 tablet (4 mg total) by mouth every 8 (eight) hours as needed for nausea or vomiting. 05/24/23   Banister, Pamela K, MD  spironolactone (ALDACTONE) 25 MG tablet Take 1 tablet by mouth daily. 05/14/21   [provider]  terconazole  (TERAZOL 3 ) 0.8 % vaginal cream Place 1 applicator vaginally at bedtime. 08/29/21   Helon Clotilda LABOR, PA-C    Family History History reviewed. No pertinent family history.  Social History Social History   Tobacco Use   Smoking status: Former    Types: Cigarettes   Smokeless tobacco: Never  Vaping Use   Vaping status: Never Used  Substance Use Topics   Alcohol use: Yes   Drug use: Not Currently    Types: Marijuana     Allergies   Amlodipine and Hydrochlorothiazide   Review of Systems Review of Systems  Constitutional:  Positive for diaphoresis. Negative for fever.  HENT:  Positive for ear pain and hearing loss. Negative for  ear discharge.      Physical Exam Triage Vital Signs ED Triage Vitals [02/06/24 1611]  Encounter Vitals Group     BP      Girls Systolic BP Percentile      Girls Diastolic BP Percentile      Boys Systolic BP Percentile      Boys Diastolic BP Percentile      Pulse      Resp      Temp      Temp src      SpO2      Weight 270 lb (122.5 kg)     Height 5' 7 (1.702 m)     Head Circumference      Peak Flow      Pain Score 7     Pain Loc      Pain Education      Exclude from Growth Chart    No data found.  Updated Vital Signs BP 127/84 (BP Location: Left Arm)   Pulse 80   Temp 98.5 F (36.9 C) (Oral)   Ht 5' 7 (1.702 m)   Wt 270 lb (122.5 kg)   LMP  (LMP Unknown)   SpO2 96%   BMI 42.29 kg/m   Visual Acuity Right Eye Distance:   Left Eye Distance:    Bilateral Distance:    Right Eye Near:   Left Eye Near:    Bilateral Near:     Physical Exam Vitals and nursing note reviewed.  Constitutional:      Appearance: Normal appearance. She is not ill-appearing.  HENT:     Head: Normocephalic and atraumatic.     Right Ear: Tympanic membrane, ear canal and external ear normal. There is no impacted cerumen.     Left Ear: External ear normal.     Ears:     Comments: The left external auditory canal is edematous and there is debris in the canal.  I am unable to visualize the TM.  Right EAC is clear and right TM is pearly gray appearance with normal light reflex. Skin:    General: Skin is warm and dry.     Capillary Refill: Capillary refill takes less than 2 seconds.     Findings: No rash.  Neurological:     General: No focal deficit present.     Mental Status: She is alert and oriented to person, place, and time.      UC Treatments / Results  Labs (all labs ordered are listed, but only abnormal results are displayed) Labs Reviewed - No data to display  EKG   Radiology No results found.  Procedures Procedures (including critical care time)  Medications Ordered in UC Medications - No data to display  Initial Impression / Assessment and Plan / UC Course  I have reviewed the triage vital signs and the nursing notes.  Pertinent labs & imaging results that were available during my care of the patient were reviewed by me and considered in my medical decision making (see chart for details).   Patient is a pleasant, nontoxic-appearing 28 year old female presenting for evaluation of left ear pain with associated decreased hearing as outlined HPI above.  On exam she does have edematous and erythematous external auditory canal and there is debris in the canal that is obscuring view of the tympanic membrane.  Given that she was recently swimming I will treat her for otitis externa with Cortisporin otic, 4 drops in her left ear 3 times  daily x 7  days.  Tylenol  and/or ibuprofen  as needed for pain.  Return precautions reviewed.  She denies need for work note.   Final Clinical Impressions(s) / UC Diagnoses   Final diagnoses:  Acute swimmer's ear of left side     Discharge Instructions      Instill 4 drops of Cortisporin otic in your left ear 3 times a day for the next 7 days for treatment of your external ear infection.  Use over-the-counter Tylenol  and ibuprofen  as needed for pain or fever.  Place a hot water bottle, or heating pad, underneath your pillowcase at night to help dilate up your ear and aid in pain relief as well as resolution of the infection.  Return for reevaluation for any new or worsening symptoms.      ED Prescriptions     Medication Sig Dispense Auth. Provider   neomycin -polymyxin-hydrocortisone (CORTISPORIN) 3.5-10000-1 OTIC suspension Place 4 drops into the left ear 3 (three) times daily. 10 mL Bernardino Ditch, NP      PDMP not reviewed this encounter.   Bernardino Ditch, NP 02/06/24 1624

## 2024-02-06 NOTE — ED Triage Notes (Signed)
 Pt c/o possible ear infection in left ear  Pt states that the pain is spreading and moving down the left side of her neck

## 2024-02-08 ENCOUNTER — Other Ambulatory Visit: Payer: Self-pay

## 2024-02-08 ENCOUNTER — Emergency Department
Admission: EM | Admit: 2024-02-08 | Discharge: 2024-02-08 | Disposition: A | Discharge status: Home / Self Care | Attending: Emergency Medicine | Admitting: Emergency Medicine

## 2024-02-08 DIAGNOSIS — J45909 Unspecified asthma, uncomplicated: Secondary | ICD-10-CM | POA: Diagnosis not present

## 2024-02-08 DIAGNOSIS — H9202 Otalgia, left ear: Secondary | ICD-10-CM | POA: Diagnosis present

## 2024-02-08 DIAGNOSIS — I10 Essential (primary) hypertension: Secondary | ICD-10-CM | POA: Insufficient documentation

## 2024-02-08 DIAGNOSIS — Z79899 Other long term (current) drug therapy: Secondary | ICD-10-CM | POA: Diagnosis not present

## 2024-02-08 DIAGNOSIS — E039 Hypothyroidism, unspecified: Secondary | ICD-10-CM | POA: Insufficient documentation

## 2024-02-08 DIAGNOSIS — H60392 Other infective otitis externa, left ear: Secondary | ICD-10-CM | POA: Insufficient documentation

## 2024-02-08 MED ORDER — IBUPROFEN 800 MG PO TABS: 800.0000 mg | ORAL_TABLET | Freq: Three times a day (TID) | ORAL | 0 refills | Status: AC | PRN

## 2024-02-08 MED ORDER — CIPROFLOXACIN HCL 500 MG PO TABS
500.0000 mg | ORAL_TABLET | Freq: Once | ORAL | Status: AC
  Administered 2024-02-08: 500 mg via ORAL
  Filled 2024-02-08: qty 1

## 2024-02-08 MED ORDER — CIPROFLOXACIN HCL 500 MG PO TABS: 500.0000 mg | ORAL_TABLET | Freq: Two times a day (BID) | ORAL | 0 refills | Status: AC

## 2024-02-08 MED ORDER — PROBIOTIC 1-250 BILLION-MG PO CAPS: 1.0000 | ORAL_CAPSULE | Freq: Two times a day (BID) | ORAL | 0 refills | Status: AC

## 2024-02-08 MED ORDER — HYDROCODONE-ACETAMINOPHEN 5-325 MG PO TABS
1.0000 | ORAL_TABLET | Freq: Once | ORAL | Status: AC
  Administered 2024-02-08: 1 via ORAL
  Filled 2024-02-08: qty 1

## 2024-02-08 MED ORDER — IBUPROFEN 800 MG PO TABS
800.0000 mg | ORAL_TABLET | Freq: Once | ORAL | Status: AC
  Administered 2024-02-08: 800 mg via ORAL
  Filled 2024-02-08: qty 1

## 2024-02-08 MED ORDER — HYDROCODONE-ACETAMINOPHEN 5-325 MG PO TABS: 1.0000 | ORAL_TABLET | Freq: Four times a day (QID) | ORAL | 0 refills | Status: AC | PRN

## 2024-02-08 NOTE — ED Provider Notes (Signed)
 [facility] Provider Note    Event Date/Time   First MD Initiated Contact with Patient 02/08/24 [phone]     (approximate)   History   Otalgia   HPI  Marissa Rice is a 28 y.o. female with history of hypertension, hypothyroidism, asthma, obesity who presents to the emergency department with complaints of left ear pain.  Patient was recently diagnosed with otitis externa at urgent care on 02/06/2024 and was started on Corticosporin drops.  She states she has been compliant with this medication but feels like her ear pain is worsening.  She does report having a fever on Thursday but none since.  States she is now having pain around the ear that goes into her jaw.  No dental pain, swelling of her gums, tongue, sore throat.  No difficulty swallowing, speaking or breathing.  She denies history of diabetes.  States she did go swimming just prior to this starting.  She denies any drainage from the ear.  States she is taking Tylenol  at home without relief.   History provided by patient.    Past Medical History:  Diagnosis Date   Hypertension    Thyroid disease     No past surgical history on file.  MEDICATIONS:  Prior to Admission medications   Medication Sig Start Date End Date Taking? Authorizing Provider  ALAYCEN 1/35 tablet TOME UNA TABLETA TODOS LOS D AS 05/14/21   [provider]  albuterol  (VENTOLIN  HFA) 108 (90 Base) MCG/ACT inhaler Inhale 2 puffs into the lungs every 4 (four) hours as needed for wheezing or shortness of breath. 02/13/22   Sung, Jade J, MD  ibuprofen  (ADVIL ) 600 MG tablet Take 1 tablet (600 mg total) by mouth every 6 (six) hours as needed. 08/06/22   Bernardino Ditch, NP  lidocaine  (XYLOCAINE ) 2 % solution Use as directed 15 mLs in the mouth or throat every 3 (three) hours as needed for mouth pain (swish and spit). 10/06/23   Arvis Jolan NOVAK, PA-C  neomycin -polymyxin-hydrocortisone (CORTISPORIN) 3.5-10000-1 OTIC suspension Place 4 drops  into the left ear 3 (three) times daily. 02/06/24   Bernardino Ditch, NP  ondansetron  (ZOFRAN -ODT) 4 MG disintegrating tablet Take 1 tablet (4 mg total) by mouth every 8 (eight) hours as needed for nausea or vomiting. 05/24/23   Banister, Pamela K, MD  spironolactone (ALDACTONE) 25 MG tablet Take 1 tablet by mouth daily. 05/14/21   [provider]  terconazole  (TERAZOL 3 ) 0.8 % vaginal cream Place 1 applicator vaginally at bedtime. 08/29/21   Helon Clotilda LABOR, PA-C    Physical Exam   Triage Vital Signs: ED Triage Vitals  Encounter Vitals Group     BP 02/08/24 0440 (!) 148/94     Girls Systolic BP Percentile --      Girls Diastolic BP Percentile --      Boys Systolic BP Percentile --      Boys Diastolic BP Percentile --      Pulse Rate 02/08/24 0440 86     Resp 02/08/24 0440 16     Temp 02/08/24 0440 98.4 F (36.9 C)     Temp Source 02/08/24 0440 Oral     SpO2 02/08/24 0440 98 %     Weight 02/08/24 0438 275 lb (124.7 kg)     Height 02/08/24 0438 5' 7 (1.702 m)     Head Circumference --      Peak Flow --      Pain Score 02/08/24 0437 10  Pain Loc --      Pain Education --      Exclude from Growth Chart --     Most recent vital signs: Vitals:   02/08/24 0440  BP: (!) 148/94  Pulse: 86  Resp: 16  Temp: 98.4 F (36.9 C)  SpO2: 98%    CONSTITUTIONAL: Alert, responds appropriately to questions. Well-appearing; well-nourished HEAD: Normocephalic, atraumatic EYES: Conjunctivae clear, pupils appear equal, sclera nonicteric ENT: normal nose; moist mucous membranes, right TM appears normal with normal external auditory canal and no cerumen impaction.  Left external auditory canal is edematous with pain with insertion of the speculum into the ear canal.  There is no cerumen impaction or foreign body.  I am only able to visualize a small amount of the left TM and it appears normal.  She has no tenderness, redness, warmth or swelling over the mastoid processes bilaterally.   Tongue sits flat in the bottom of the mouth.  No cervical lymphadenopathy.  No tenderness over the salivary glands.  No facial swelling, redness or warmth.  No sign of drainable dental abscess.  No angioedema.  No trismus, drooling.  Normal phonation. NECK: Supple, normal ROM CARD: RRR; S1 and S2 appreciated RESP: Normal chest excursion without splinting or tachypnea; breath sounds clear and equal bilaterally; no wheezes, no rhonchi, no rales, no hypoxia or respiratory distress, speaking full sentences ABD/GI: Non-distended; soft, non-tender, no rebound, no guarding, no peritoneal signs BACK: The back appears normal EXT: Normal ROM in all joints; no deformity noted, no edema SKIN: Normal color for age and race; warm; no rash on exposed skin NEURO: Moves all extremities equally, normal speech PSYCH: The patient's mood and manner are appropriate.   ED Results / Procedures / Treatments   LABS: (all labs ordered are listed, but only abnormal results are displayed) Labs Reviewed - No data to display   EKG:   RADIOLOGY: My personal review and interpretation of imaging:    I have personally reviewed all radiology reports.   No results found.   PROCEDURES:  Critical Care performed: No    Procedures    IMPRESSION / MDM / ASSESSMENT AND PLAN / ED COURSE  I reviewed the triage vital signs and the nursing notes.    Patient here with otitis externa.  Not improving with Cortisporin drops.     DIFFERENTIAL DIAGNOSIS (includes but not limited to):   Suspect infective otitis externa especially given fever.  Doubt fungal causes as patient denies history of diabetes.  No signs of mastoiditis.  No signs of otitis media however exam limited due to swelling of the external auditory canal.  No sign of dental abscess, facial cellulitis, deep space neck infection, PTA, tonsillitis, Ludwig's angina on exam.   Patient's presentation is most consistent with acute complicated illness / injury  requiring diagnostic workup.   PLAN: Patient here for otitis externa.  States symptoms worsening despite topical antibiotics.  Will start her on Cipro .  Will discharge with short course of narcotic pain medication as well.  TM is difficult to visualize but I do not appreciate any otitis media or perforation on what I can see.  There is no signs of mastoiditis.  No signs of facial cellulitis, tonsillitis, dental abscess, sialoadenitis, parotitis, deep space neck infection, PTA on exam.  Discussed return precautions.  Patient comfortable with this plan.   MEDICATIONS GIVEN IN ED: Medications  ibuprofen  (ADVIL ) tablet 800 mg (800 mg Oral Given 02/08/24 0530)  HYDROcodone -acetaminophen  (NORCO/VICODIN) 5-325 MG per  tablet 1 tablet (1 tablet Oral Given 02/08/24 0530)  ciprofloxacin  (CIPRO ) tablet 500 mg (500 mg Oral Given 02/08/24 0530)     ED COURSE:  At this time, I do not feel there is any life-threatening condition present. I reviewed all nursing notes, vitals, pertinent previous records.  All lab and urine results, EKGs, imaging ordered have been independently reviewed and interpreted by myself.  I reviewed all available radiology reports from any imaging ordered this visit.  Based on my assessment, I feel the patient is safe to be discharged home without further emergent workup and can continue workup as an outpatient as needed. Discussed all findings, treatment plan as well as usual and customary return precautions.  They verbalize understanding and are comfortable with this plan.  Outpatient follow-up has been provided as needed.  All questions have been answered.    CONSULTS:  none   OUTSIDE RECORDS REVIEWED: Reviewed recent urgent care notes.       FINAL CLINICAL IMPRESSION(S) / ED DIAGNOSES   Final diagnoses:  Acute infective otitis externa, left     Rx / DC Orders   ED Discharge Orders          Ordered    ciprofloxacin  (CIPRO ) 500 MG tablet  2 times daily        02/08/24  0523    Bacillus Coagulans-Inulin (PROBIOTIC) 1-250 BILLION-MG CAPS  2 times daily        02/08/24 0523    HYDROcodone -acetaminophen  (NORCO/VICODIN) 5-325 MG tablet  Every 6 hours PRN        02/08/24 0523    ibuprofen  (ADVIL ) 800 MG tablet  Every 8 hours PRN        02/08/24 0523             Note:  This document was prepared using Dragon voice recognition software and may include unintentional dictation errors.   Luca Dyar, Josette SAILOR, DO 02/08/24 [phone]

## 2024-02-08 NOTE — Discharge Instructions (Addendum)

## 2024-02-08 NOTE — ED Triage Notes (Signed)
 Pt comes in POV for l. Ear pain, was seen at [facility] on Friday for same prescribed Neomycin  drops. Pt c/o increasing pain radiating to collar bone, 10/10 pain, swelling extending to ear and neck preventing sleep. Some redness to l cheek noted.

## 2024-02-08 NOTE — ED Notes (Signed)
 Pt given discharge paperwork and  education about discharge prescriptions.

## 2024-04-06 ENCOUNTER — Other Ambulatory Visit: Payer: Self-pay

## 2024-04-06 ENCOUNTER — Emergency Department
Admission: EM | Admit: 2024-04-06 | Discharge: 2024-04-06 | Disposition: A | Discharge status: Home / Self Care | Payer: Self-pay | Attending: Emergency Medicine | Admitting: Emergency Medicine

## 2024-04-06 DIAGNOSIS — Z8379 Family history of other diseases of the digestive system: Secondary | ICD-10-CM | POA: Insufficient documentation

## 2024-04-06 DIAGNOSIS — I10 Essential (primary) hypertension: Secondary | ICD-10-CM | POA: Insufficient documentation

## 2024-04-06 DIAGNOSIS — R1013 Epigastric pain: Secondary | ICD-10-CM | POA: Insufficient documentation

## 2024-04-06 LAB — COMPREHENSIVE METABOLIC PANEL WITH GFR
ALT: 66 U/L — ABNORMAL HIGH (ref 0–44)
AST: 37 U/L (ref 15–41)
Albumin: 4.2 g/dL (ref 3.5–5.0)
Alkaline Phosphatase: 61 U/L (ref 38–126)
Anion gap: 9 (ref 5–15)
BUN: 14 mg/dL (ref 6–20)
CO2: 25 mmol/L (ref 22–32)
Calcium: 8.9 mg/dL (ref 8.9–10.3)
Chloride: 104 mmol/L (ref 98–111)
Creatinine, Ser: 0.57 mg/dL (ref 0.44–1.00)
GFR, Estimated: >60 mL/min (ref >60)
Glucose, Bld: 117 mg/dL — ABNORMAL HIGH (ref 70–99)
Potassium: 3.8 mmol/L (ref 3.5–5.1)
Sodium: 138 mmol/L (ref 135–145)
Total Bilirubin: 0.9 mg/dL (ref 0.0–1.2)
Total Protein: 7.5 g/dL (ref 6.5–8.1)

## 2024-04-06 LAB — URINALYSIS, ROUTINE W REFLEX MICROSCOPIC
Bilirubin Urine: NEGATIVE
Glucose, UA: NEGATIVE mg/dL
Hgb urine dipstick: NEGATIVE
Ketones, ur: NEGATIVE mg/dL
Leukocytes,Ua: NEGATIVE
Nitrite: NEGATIVE
Protein, ur: NEGATIVE mg/dL
Specific Gravity, Urine: 1.028 (ref 1.005–1.030)
pH: 5 (ref 5.0–8.0)

## 2024-04-06 LAB — CBC
HCT: 44.1 % (ref 36.0–46.0)
Hemoglobin: 15.2 g/dL — ABNORMAL HIGH (ref 12.0–15.0)
MCH: 28.6 pg (ref 26.0–34.0)
MCHC: 34.5 g/dL (ref 30.0–36.0)
MCV: 83.1 fL (ref 80.0–100.0)
Platelets: 284 K/uL (ref 150–400)
RBC: 5.31 MIL/uL — ABNORMAL HIGH (ref 3.87–5.11)
RDW: 13.5 % (ref 11.5–15.5)
WBC: 9 K/uL (ref 4.0–10.5)
nRBC: 0 % (ref 0.0–0.2)

## 2024-04-06 LAB — LIPASE, BLOOD: Lipase: 38 U/L (ref 11–51)

## 2024-04-06 LAB — POC URINE PREG, ED: Preg Test, Ur: NEGATIVE

## 2024-04-06 MED ORDER — SUCRALFATE 1 G PO TABS: 1.0000 g | ORAL_TABLET | Freq: Four times a day (QID) | ORAL | 0 refills | Status: AC

## 2024-04-06 MED ORDER — PANTOPRAZOLE SODIUM 40 MG PO TBEC: 40.0000 mg | DELAYED_RELEASE_TABLET | Freq: Every day | ORAL | 0 refills | Status: AC

## 2024-04-06 NOTE — ED Provider Notes (Signed)
 [facility] Provider Note   Event Date/Time   First MD Initiated Contact with Patient 04/06/24 [phone]     (approximate) History  Abdominal Pain  HPI Marissa Rice is a 28 y.o. female with a stated past medical history of GERD and clinical history of morbid obesity who presents complaining of epigastric abdominal pain that is been present over the last 24 hours and is described as burning, nonradiating, with no exacerbating or relieving factors.  Patient has been taking over-the-counter Maalox for the symptoms with minimal relief in her pain.  Patient denies any personal or family history of early cardiac disease. ROS: Patient currently denies any vision changes, tinnitus, difficulty speaking, facial droop, sore throat, chest pain, shortness of breath, nausea/vomiting/diarrhea, dysuria, or weakness/numbness/paresthesias in any extremity   Physical Exam  Triage Vital Signs: ED Triage Vitals  Encounter Vitals Group     BP 04/06/24 0650 (!) 152/103     Girls Systolic BP Percentile --      Girls Diastolic BP Percentile --      Boys Systolic BP Percentile --      Boys Diastolic BP Percentile --      Pulse Rate 04/06/24 0650 77     Resp 04/06/24 0650 19     Temp 04/06/24 0650 98.4 F (36.9 C)     Temp src --      SpO2 04/06/24 0650 98 %     Weight 04/06/24 0649 275 lb (124.7 kg)     Height 04/06/24 0649 5' 7 (1.702 m)     Head Circumference --      Peak Flow --      Pain Score 04/06/24 0649 5     Pain Loc --      Pain Education --      Exclude from Growth Chart --    Most recent vital signs: Vitals:   04/06/24 0650 04/06/24 0800  BP: (!) 152/103 129/88  Pulse: 77 68  Resp: 19 20  Temp: 98.4 F (36.9 C) 98.4 F (36.9 C)  SpO2: 98% 100%   General: Awake, oriented x4. CV:  Good peripheral perfusion. Resp:  Normal effort. Abd:  No distention. Other:  Middle-aged obese Hispanic female resting comfortably in no acute distress ED Results / Procedures /  Treatments  Labs (all labs ordered are listed, but only abnormal results are displayed) Labs Reviewed  COMPREHENSIVE METABOLIC PANEL WITH GFR - Abnormal; Notable for the following components:      Result Value   Glucose, Bld 117 (*)    ALT 66 (*)    All other components within normal limits  CBC - Abnormal; Notable for the following components:   RBC 5.31 (*)    Hemoglobin 15.2 (*)    All other components within normal limits  URINALYSIS, ROUTINE W REFLEX MICROSCOPIC - Abnormal; Notable for the following components:   Color, Urine YELLOW (*)    APPearance HAZY (*)    All other components within normal limits  LIPASE, BLOOD  POC URINE PREG, ED   EKG ED ECG REPORT I, Artist MARLA Kerns, the attending physician, personally viewed and interpreted this ECG. Date: 04/06/2024 EKG Time: 0655 Rate: 80 Rhythm: normal sinus rhythm QRS Axis: normal Intervals: normal ST/T Wave abnormalities: normal Narrative Interpretation: no evidence of acute ischemia PROCEDURES: Critical Care performed: No Procedures MEDICATIONS ORDERED IN ED: Medications - No data to display IMPRESSION / MDM / ASSESSMENT AND PLAN / ED COURSE  I reviewed the triage vital  signs and the nursing notes.                             The patient is on the cardiac monitor to evaluate for evidence of arrhythmia and/or significant heart rate changes. Patient's presentation is most consistent with acute presentation with potential threat to life or bodily function. Patient is a 28 year old female with the above-stated past medical history presents complaining of midepigastric abdominal pain with no radiation and described as burning and has been present over the last day in the setting of reflux and recent intake of greasy and spicy foods. DDx: Pancreatitis, ACS, biliary disease, Mallory-Weiss tear, Boerhaave syndrome Plan: CBC, CMP, lipase, UA showing no evidence of acute abnormalities EKG shows no signs of ischemia  Patient  is in no acute distress at this time.  Patient does have mildly elevated blood pressure likely secondary to discomfort from her epigastric pain.  Patient encouraged to continue taking her blood pressure daily and monitor for a trend of hypertension as well as talking to her primary care physician if this occurs.  At this time I have low suspicion for patient having any cardiac cause for this pain given that it is not exertional patient has no history of cardiac disease, smoking, or diaphoresis with this pain.  Lipase normal and no bloody emesis.  At this time I am concerned that patient is having worsening of her reflux symptoms and encouraged sucralfate  and Protonix  as well as follow-up with her primary care physician if symptoms persist  Dispo: Discharge home with PCP follow-up   FINAL CLINICAL IMPRESSION(S) / ED DIAGNOSES   Final diagnoses:  Epigastric pain  Family history of GERD   Rx / DC Orders   ED Discharge Orders          Ordered    pantoprazole  (PROTONIX ) 40 MG tablet  Daily        04/06/24 0803    sucralfate  (CARAFATE ) 1 g tablet  4 times daily        04/06/24 0803           Note:  This document was prepared using Dragon voice recognition software and may include unintentional dictation errors.   Jossie Artist POUR, MD 04/06/24 [phone]

## 2024-04-06 NOTE — ED Triage Notes (Signed)
 Pt reports upper abd pain that began last night, pt also reports dizziness and HTN. Pt reports hx HTN and has been taking prescribed meds. Pt endorses nausea.

## 2024-09-01 ENCOUNTER — Ambulatory Visit (INDEPENDENT_AMBULATORY_CARE_PROVIDER_SITE_OTHER): Payer: Self-pay

## 2024-09-01 DIAGNOSIS — L600 Ingrowing nail: Secondary | ICD-10-CM

## 2024-09-01 NOTE — Progress Notes (Signed)
 "  Subjective:  Patient ID: Marissa Rice, female    DOB: 1995-08-08,  MRN: [id number]  Chief Complaint  Patient presents with   Nail Problem    New pt-ingrown toe nails big toes left foot worse-she states the 2nd toe on the left foot is also feeling ingrown    29 y.o. female presents with the above complaint.  She has had pain to her left hallux nail that has been ingrown for multiple months.  It has recently become more red, inflamed, painful.  She denies seeing any purulence.  She does also have pain to the borders of her right hallux and left second digit nails.   Review of Systems: Negative except as noted in the HPI. Denies N/V/F/Ch.  Past Medical History:  Diagnosis Date   Hypertension    Thyroid disease    Current Medications[1]  Tobacco Use History[2]  Allergies[3] Objective:  There were no vitals filed for this visit. There is no height or weight on file to calculate BMI. Constitutional Well developed. Well nourished.  Vascular Dorsalis pedis pulses palpable bilaterally. Posterior tibial pulses palpable bilaterally. Capillary refill normal to all digits.  No cyanosis or clubbing noted. Pedal hair growth normal.  Neurologic Normal speech. Oriented to person, place, and time. Epicritic sensation to light touch grossly present bilaterally.  Dermatologic Painful ingrowing nail at bilateral borders of left hallux nail, left second digit, right hallux nail.  Left second digit and right hallux have mild erythema, edema without any open wound, drainage or sign of infection.  Left hallux has moderate erythema, edema and mild seropurulent drainage. Nails 1 through 5 bilaterally thickened, dystrophic, crumbly in texture No other open wounds. No skin lesions.  Orthopedic: Normal joint ROM without pain or crepitus bilaterally. No visible deformities. No bony tenderness.   Radiographs: None Assessment:   1. Ingrown nail of second toe of left foot   2. Ingrown nail of  great toe of left foot   3. Ingrown nail of great toe of right foot    Plan:  Patient was evaluated and treated and all questions answered.  Ingrown Nail, bilaterally -Discussed treatment options ranging from surgical to conservative along with risks and benefits of each. Patient expresses understanding. Patient elects to proceed with minor surgery to remove ingrown toenail removal today. Consent reviewed and signed by patient. - Discussed indication for left hallux nail removal due to infection. Patient relates to pain from right hallux and left 2nd digit as well at nail borders. We discussed full nail removal compared to partial nail avulsions. She would like to move forward with whole nail removals.  -Ingrown nail excised. See procedure note. -Educated on post-procedure care including soaking. Written instructions provided and reviewed. -Patient to follow up in 2 weeks for nail check.  Onychomycosis - Discussed possible treatment with terbinafine. Patient has had mild elevation in her liver labs. Do not recommend terbinafine for this reason. Can discuss penlac as option when nails begin growing in.   Procedure: Excision of Ingrown Toenail Location: Bilateral 1st toe and left 2nd toe whole nail Anesthesia: Lidocaine  1% plain; 1.5 mL and Marcaine 0.5% plain; 1.5 mL, digital block. Skin Prep: Betadine. Dressing: Silvadene; telfa; dry, sterile, compression dressing. Technique: Following skin prep, the toe was exsanguinated and a tourniquet was secured at the base of the toe. The affected nail was freed, and excised. The site was then irrigated out with alcohol. The tourniquet was then removed and sterile dressing applied. Disposition: Patient tolerated procedure well. Patient  to return in 2 weeks for follow-up.  Post op care: Keep dressing clean, dry, and intact for 24 hours before removing. Soak operative foot and redress BID as printed instructions describe. Patient educated on signs of  infection, pt expresses understanding and will proceed for prompt medical care if signs of infection arise.   No follow-ups on file.  Prentice Ovens, DPM AACFAS Fellowship Trained Podiatric Surgeon [facility]     [1]  Current Outpatient Medications:    ALAYCEN 1/35 tablet, TOME UNA TABLETA TODOS LOS D AS, Disp: , Rfl:    albuterol  (VENTOLIN  HFA) 108 (90 Base) MCG/ACT inhaler, Inhale 2 puffs into the lungs every 4 (four) hours as needed for wheezing or shortness of breath., Disp: 18 g, Rfl: 0   HYDROcodone -acetaminophen  (NORCO/VICODIN) 5-325 MG tablet, Take 1 tablet by mouth every 6 (six) hours as needed., Disp: 12 tablet, Rfl: 0   ibuprofen  (ADVIL ) 800 MG tablet, Take 1 tablet (800 mg total) by mouth every 8 (eight) hours as needed., Disp: 30 tablet, Rfl: 0   lidocaine  (XYLOCAINE ) 2 % solution, Use as directed 15 mLs in the mouth or throat every 3 (three) hours as needed for mouth pain (swish and spit)., Disp: 100 mL, Rfl: 0   neomycin -polymyxin-hydrocortisone (CORTISPORIN) 3.5-10000-1 OTIC suspension, Place 4 drops into the left ear 3 (three) times daily., Disp: 10 mL, Rfl: 0   ondansetron  (ZOFRAN -ODT) 4 MG disintegrating tablet, Take 1 tablet (4 mg total) by mouth every 8 (eight) hours as needed for nausea or vomiting., Disp: 10 tablet, Rfl: 0   pantoprazole  (PROTONIX ) 40 MG tablet, Take 1 tablet (40 mg total) by mouth daily., Disp: 42 tablet, Rfl: 0   spironolactone (ALDACTONE) 25 MG tablet, Take 1 tablet by mouth daily., Disp: , Rfl:    sucralfate  (CARAFATE ) 1 g tablet, Take 1 tablet (1 g total) by mouth 4 (four) times daily., Disp: 120 tablet, Rfl: 0   terconazole  (TERAZOL 3 ) 0.8 % vaginal cream, Place 1 applicator vaginally at bedtime., Disp: 20 g, Rfl: 0 [2]  Social History Tobacco Use  Smoking Status Former   Types: Cigarettes  Smokeless Tobacco Never  [3]  Allergies Allergen Reactions   Amlodipine Other (See Comments)    Per patient,  Made me feel dizzy and  weak   Hydrochlorothiazide Nausea And Vomiting   "

## 2024-09-01 NOTE — Patient Instructions (Signed)
